# Patient Record
Sex: Female | Born: 1975 | Race: White | Hispanic: No | Marital: Married | Smoking: Never smoker
Health system: Southern US, Community
[De-identification: ages and names within clinical notes are randomized; demographics above are authoritative.]

## PROBLEM LIST (undated history)

## (undated) DIAGNOSIS — K219 Gastro-esophageal reflux disease without esophagitis: Secondary | ICD-10-CM

## (undated) DIAGNOSIS — F419 Anxiety disorder, unspecified: Secondary | ICD-10-CM

## (undated) DIAGNOSIS — M722 Plantar fascial fibromatosis: Secondary | ICD-10-CM

## (undated) HISTORY — DX: Plantar fascial fibromatosis: M72.2

## (undated) HISTORY — DX: Gastro-esophageal reflux disease without esophagitis: K21.9

## (undated) HISTORY — DX: Anxiety disorder, unspecified: F41.9

---

## 2002-12-30 ENCOUNTER — Encounter: Payer: Self-pay | Admitting: Obstetrics and Gynecology

## 2002-12-30 ENCOUNTER — Inpatient Hospital Stay (HOSPITAL_COMMUNITY): Admission: AD | Admit: 2002-12-30 | Discharge: 2002-12-30 | Payer: Self-pay | Admitting: Obstetrics and Gynecology

## 2003-01-18 ENCOUNTER — Other Ambulatory Visit: Admission: RE | Admit: 2003-01-18 | Discharge: 2003-01-18 | Payer: Self-pay | Admitting: *Deleted

## 2003-06-13 ENCOUNTER — Encounter: Admission: RE | Admit: 2003-06-13 | Discharge: 2003-06-13 | Payer: Self-pay | Admitting: Neurology

## 2003-12-28 ENCOUNTER — Other Ambulatory Visit: Admission: RE | Admit: 2003-12-28 | Discharge: 2003-12-28 | Payer: Self-pay | Admitting: Obstetrics and Gynecology

## 2004-04-07 HISTORY — PX: TUBAL LIGATION: SHX77

## 2004-06-14 ENCOUNTER — Other Ambulatory Visit: Admission: RE | Admit: 2004-06-14 | Discharge: 2004-06-14 | Payer: Self-pay | Admitting: Obstetrics and Gynecology

## 2004-12-24 ENCOUNTER — Inpatient Hospital Stay (HOSPITAL_COMMUNITY): Admission: RE | Admit: 2004-12-24 | Discharge: 2004-12-27 | Payer: Self-pay | Admitting: Obstetrics and Gynecology

## 2005-02-03 ENCOUNTER — Other Ambulatory Visit: Admission: RE | Admit: 2005-02-03 | Discharge: 2005-02-03 | Payer: Self-pay | Admitting: Obstetrics and Gynecology

## 2006-11-20 ENCOUNTER — Encounter (INDEPENDENT_AMBULATORY_CARE_PROVIDER_SITE_OTHER): Payer: Self-pay | Admitting: Obstetrics and Gynecology

## 2006-11-20 ENCOUNTER — Inpatient Hospital Stay (HOSPITAL_COMMUNITY): Admission: RE | Admit: 2006-11-20 | Discharge: 2006-11-22 | Payer: Self-pay | Admitting: Obstetrics and Gynecology

## 2006-11-23 ENCOUNTER — Encounter: Admission: RE | Admit: 2006-11-23 | Discharge: 2006-11-25 | Payer: Self-pay | Admitting: Obstetrics and Gynecology

## 2007-03-11 ENCOUNTER — Emergency Department (HOSPITAL_COMMUNITY): Admission: EM | Admit: 2007-03-11 | Discharge: 2007-03-11 | Payer: Self-pay | Admitting: Emergency Medicine

## 2010-02-06 ENCOUNTER — Emergency Department (HOSPITAL_BASED_OUTPATIENT_CLINIC_OR_DEPARTMENT_OTHER): Admission: EM | Admit: 2010-02-06 | Discharge: 2010-02-06 | Payer: Self-pay | Admitting: Emergency Medicine

## 2010-03-21 ENCOUNTER — Encounter
Admission: RE | Admit: 2010-03-21 | Discharge: 2010-03-21 | Payer: Self-pay | Source: Home / Self Care | Attending: Gastroenterology | Admitting: Gastroenterology

## 2010-03-28 ENCOUNTER — Ambulatory Visit (HOSPITAL_COMMUNITY): Admission: RE | Admit: 2010-03-28 | Payer: Self-pay | Source: Home / Self Care | Admitting: Gastroenterology

## 2010-04-28 ENCOUNTER — Encounter: Payer: Self-pay | Admitting: Gastroenterology

## 2010-06-18 LAB — COMPREHENSIVE METABOLIC PANEL
AST: 20 U/L (ref 0–37)
Albumin: 4.7 g/dL (ref 3.5–5.2)
BUN: 20 mg/dL (ref 6–23)
CO2: 24 mEq/L (ref 19–32)
Calcium: 9.5 mg/dL (ref 8.4–10.5)
Creatinine, Ser: 0.9 mg/dL (ref 0.4–1.2)
GFR calc Af Amer: >60 mL/min (ref >60)
GFR calc non Af Amer: >60 mL/min (ref >60)
Total Bilirubin: 0.6 mg/dL (ref 0.3–1.2)

## 2010-06-18 LAB — DIFFERENTIAL
Basophils Absolute: 0.1 10*3/uL (ref 0.0–0.1)
Eosinophils Relative: 2 % (ref 0–5)
Lymphocytes Relative: 28 % (ref 12–46)
Lymphs Abs: 2.1 10*3/uL (ref 0.7–4.0)
Neutro Abs: 4.8 10*3/uL (ref 1.7–7.7)

## 2010-06-18 LAB — CBC
MCH: 29 pg (ref 26.0–34.0)
MCHC: 33.8 g/dL (ref 30.0–36.0)
MCV: 85.7 fL (ref 78.0–100.0)
Platelets: 310 10*3/uL (ref 150–400)

## 2010-06-18 LAB — PREGNANCY, URINE: Preg Test, Ur: NEGATIVE

## 2010-06-18 LAB — URINALYSIS, ROUTINE W REFLEX MICROSCOPIC
Leukocytes, UA: NEGATIVE
Nitrite: NEGATIVE
Protein, ur: NEGATIVE mg/dL
Specific Gravity, Urine: 1.031 — ABNORMAL HIGH (ref 1.005–1.030)
Urobilinogen, UA: 0.2 mg/dL (ref 0.0–1.0)

## 2010-06-18 LAB — URINE MICROSCOPIC-ADD ON

## 2010-06-18 LAB — LIPASE, BLOOD: Lipase: 29 U/L (ref 23–300)

## 2010-08-20 NOTE — Op Note (Signed)
NAME:  Kelly Huffman, Kelly Huffman              ACCOUNT NO.:  [id number]   MEDICAL RECORD NO.:  [id number]          PATIENT TYPE:  AMB   LOCATION:  SDC                           FACILITY:  [facility]   PHYSICIAN:  Michelle L. Grewal, M.D.DATE OF BIRTH:  07-02-1975   DATE OF PROCEDURE:  11/20/2006  DATE OF DISCHARGE:                               OPERATIVE REPORT   PREOPERATIVE DIAGNOSES:  1. Intrauterine pregnancy at term.  2. Previous cesarean section.  3. Desires permanent sterilization.   POSTOPERATIVE DIAGNOSES:  1. Intrauterine pregnancy at term.  2. Previous cesarean section.  3. Desires permanent sterilization.  4. Uterine window.   PROCEDURES:  1. Repeat low transverse cesarean section.  2. Bilateral tubal ligation.   SURGEON:  Michelle L. Vincente Poli, MD   ASSISTANT:  Zelphia Cairo, MD   ANESTHESIA:  Spinal.   PATHOLOGY:  Fallopian tube segments.   PROCEDURE:  The patient was given informed consent.  After she was  consented, she was taken to the operating room.  A spinal was placed by  anesthesia.  She was then prepped and draped and Foley catheter was  inserted and draining clear urine.  A low transverse incision was made  in the area of previous C-section scar and carried down to the fascia.  The fascia was scored in the midline and extended laterally.  The rectus  muscles were noted to be in the midline.  When we attempted to separate  them in the midline with a hemostat, we noted suddenly a gush of  amniotic fluid emanating from the upper aspect of the rectus muscles.  We then separated the rectus muscles and noticed that just beneath the  incision of the rectus muscles there was a very large uterine window and  we could see the baby's ear.  There no need to be to use a scalpel for  incision on the uterus.  The baby was delivered in cephalic presentation  quite easily.  It was a female infant, Apgars 9 at one minute and 9 at  five minutes, and weighed 8 pounds 3 ounces.  The  cord was clamped and  cut and the baby was handed to the waiting neonatal team.  The baby was  very vigorous in the operating room.  The placenta was manually removed,  noted be normal, intact, with three-vessel cord.  Pitocin and  antibiotics were given.  The uterus was exteriorized and the cavity was  cleared of all clots and debris.  There was no extension noted.  The  area of the uterine window appeared to be coming from the region where  she had her previous C-section.  The incision was closed using 0 chromic  in continuous running locked stitch.  Hemostasis was excellent.  We then  performed a modified Pomeroy bilateral tubal ligation by grasping each  midportion of the fallopian tube with a Babcock clamp and tying it off  with using plain gut suture x2 and excising the tied-off knuckle of  fallopian tube with Metzenbaum scissors.  Hemostasis was excellent.  Each tube was sent separately to pathology.  The uterus was  returned to  the abdomen.  Irrigation was performed.  Hemostasis was noted at all  three surgical sites.  The rectus muscles and peritoneum were closed  using 0 Vicryl.  The fascia was closed using 0 Vicryl in  running stitch  x2 starting at each corner and meeting in the midline.  After irrigation  of the subcutaneous layer, the skin was closed with staples.  All  sponge, lap and instrument counts were correct x2.  The patient went to  recovery room in stable condition.      Michelle L. Vincente Poli, M.D.  Electronically Signed     MLG/MEDQ  D:  11/20/2006  T:  11/20/2006  Job:  [id number]

## 2010-08-23 NOTE — Discharge Summary (Signed)
NAMEJUANETTA, NEGASH              ACCOUNT NO.:  [id number]   MEDICAL RECORD NO.:  [id number]          PATIENT TYPE:  INP   LOCATION:  9148                          FACILITY:  [facility]   PHYSICIAN:  Juluis Mire, M.D.   DATE OF BIRTH:  04-26-1975   DATE OF ADMISSION:  11/20/2006  DATE OF DISCHARGE:  11/22/2006                               DISCHARGE SUMMARY   ADMITTING DIAGNOSIS:  1. Intrauterine pregnancy at term  2. Previous cesarean delivery, desires repeat  3. Multiparity, desires permanent sterilization.   DISCHARGE DIAGNOSIS:  1. Status post low transverse cesarean section  2. A viable female infant  3. Permanent sterilization.   PROCEDURE:  1. Repeat low transverse cesarean section  2. Bilateral tubal ligation.   REASON FOR ADMISSION:  Please see written H&P.   HOSPITAL COURSE:  The patient is 35 year old gravida 3, para 1 that  presented to [facility] for scheduled cesarean section.  Due to multiparity the patient has also requested bilateral tubal  ligation.  On the morning of admission the patient was taken to  operating room where spinal anesthesia was administered without  difficulty.  A low transverse incision was made with delivery of a  viable female infant weighing 8 pounds 3 ounces, Apgars 9 at 1 minute  and 9 at 5 minutes.  The patient tolerated procedure well and taken to  the recovery room in stable condition.  On postoperative day #1 the  patient without complaint.  Vital signs were stable.  She is afebrile.  Fundus was firm and nontender.  Abdominal dressing noted to be clean,  dry and intact.  Laboratory findings revealed hemoglobin of 11.0.  On  postoperative day #2 the patient was without complaint.  She did desire  early discharge.  Vital signs were stable.  She was afebrile, fundus was  firm and nontender.  Abdominal dressing had been removed revealing an  incision that is clean, dry and intact.  Staples were intact.  Discharge  instructions were reviewed and the patient was later discharged home.   CONDITION ON DISCHARGE:  Good, diet regular as tolerated.   ACTIVITY:  No heavy lifting, no driving x2 weeks, no vaginal entry.   FOLLOW UP:  Patient follow up in the office in 2-3 days for staple  removal.  She is to call for temperature greater than 100 degrees,  persistent nausea, vomiting, heavy vaginal bleeding and/or redness or  drainage from incisional site.   DISCHARGE MEDICATIONS:  Tylox #30 1 p.o. q.4-6h.  Motrin 600 mg every 6  hours.  Prenatal vitamins one p.o. daily, Colace 1 p.o. daily.      Julio Sicks, N.P.      Juluis Mire, M.D.  Electronically Signed    CC/MEDQ  D:  12/29/2006  T:  12/29/2006  Job:  [id number]

## 2010-08-23 NOTE — Discharge Summary (Signed)
Kelly Huffman, Kelly Huffman              ACCOUNT NO.:  [id number]   MEDICAL RECORD NO.:  [id number]          PATIENT TYPE:  INP   LOCATION:  9147                          FACILITY:  [facility]   PHYSICIAN:  Juluis Mire, M.D.   DATE OF BIRTH:  1975-11-17   DATE OF ADMISSION:  12/24/2004  DATE OF DISCHARGE:  12/27/2004                                 DISCHARGE SUMMARY   ADMISSION DIAGNOSES:  1.  Intrauterine pregnancy at term.  2.  Breech presentation.   DISCHARGE DIAGNOSES:  1.  Status post low transverse cesarean section.  2.  Viable female infant.   PROCEDURE:  Primary low transverse cesarean section.   REASON FOR ADMISSION:  Please see written H&P.   HOSPITAL COURSE:  The patient is a 35 year old primigravida that was  admitted to [facility] at term with a known breech  presentation.  The patient had been scheduled for a cesarean delivery.  On  the morning of admission, the patient was taken to the operating room where  spinal anesthesia was administered without difficulty.  A low transverse  incision was made with the delivery of a viable female infant weighing 8  pounds 6 ounces with Apgars of 9 at one minute and 9 at five minutes.  The  patient tolerated the procedure well and was taken to the recovery room in  stable condition.  On postoperative day #1, the patient was without  complaint, vital signs were stable.  Abdomen was soft, fundus was firm and  nontender.  Abdominal dressing was noted to be clean, dry, and intact.  Laboratory findings revealed hemoglobin of 10.0, platelet count 198,000, WBC  count of 10.2.  On postoperative day #2, the patient had experienced some  nausea, vital signs were stable.  Abdomen was flat and soft with positive  bowel sounds, however, somewhat sluggish.  The patient was given antiemetics  with resolution of nausea and vomiting.  On postoperative day #3, the  patient now is without complaint.  No further nausea and vomiting.  Vital  signs were stable.  She was tolerating a regular diet without complaints.  Fundus was firm and nontender.  Incision was clean, dry, and intact.  Staples were removed and the patient was discharged home.   CONDITION ON DISCHARGE:  Good.   DIET:  Regular as tolerated.   ACTIVITY:  No heavy lifting, no driving x2 weeks, and no vaginal entry.   FOLLOW UP:  The patient is to follow up in the office in 1 week for an  incision check.  She is to call for a temperature greater than 100 degrees,  persistent nausea and vomiting, heavy vaginal bleeding, and/or redness or  drainage from the incisional site.   DISCHARGE MEDICATIONS:  1.  Motrin 600 mg every 6 hours p.r.n.  2.  Prenatal vitamins one p.o. daily.      Julio Sicks, N.P.      Juluis Mire, M.D.  Electronically Signed    CC/MEDQ  D:  02/16/2005  T:  02/16/2005  Job:  [id number]

## 2010-08-23 NOTE — Op Note (Signed)
NAME:  Kelly Huffman, Kelly Huffman              ACCOUNT NO.:  [id number]   MEDICAL RECORD NO.:  [id number]          PATIENT TYPE:  INP   LOCATION:  9147                          [facility]ACILITY:  [facility]   PHYSICIAN:  Michelle L. Grewal, M.D.DATE OF BIRTH:  1975-11-30   DATE OF PROCEDURE:  12/24/2004  DATE OF DISCHARGE:                                 OPERATIVE REPORT   PREOPERATIVE DIAGNOSES:  1.  Intrauterine pregnancy at term.  2.  Breech presentation.   POSTOPERATIVE DIAGNOSES:  1.  Intrauterine pregnancy at term.  2.  Breech presentation.   PROCEDURE:  Primary low transverse cesarean section.   SURGEON:  Michelle L. Vincente Poli, M.D.   ANESTHESIA:  Spinal.   SPECIMENS:  Female infant, frank breech presentation, Apgars 9 at one minute  and 9 at five minutes, weighing 8 pounds 6 ounces.   ESTIMATED BLOOD LOSS:  500 mL.   COMPLICATIONS:  None.   DRAINS:  Foley.   PATHOLOGY:  None.   PROCEDURE:  The patient was taken to the operating room, where spinal was  placed without incident and she was prepped and draped in the usual sterile  fashion.  A Foley catheter was inserted into the bladder and draining clear  urine.  A sterile drape applied.  A low transverse incision was made,  carried down to the fascia, and fascia was scored in the midline.  The  rectus muscles were separated in the midline and the peritoneum was entered  bluntly.  The peritoneal incision was then stretched.  The bladder blade was  inserted and the lower uterine segment was identified and the bladder flap  was readjusted.  A low transverse incision was made after the bladder flap  was developed and the low transverse incision was made in the uterus.  The  uterus was entered using a hemostat.  The baby was in frank breech  presentation and was delivered quite easily.  There was a loose nuchal cord  x1.  Apgars were 9 at one minute and 9 at five minutes with a weight of 8  pounds 6 ounces.  After the cord was clamped and cut, the  cord blood was  obtained.  Placenta was manually removed and noted to be normal and intact  with a  three-vessel cord.  The uterus was then cleared of all clots and  debris.  The uterine incision was closed using 0 chromic in a continuous  running locked stitch.  It was hemostatic.  Irrigation was performed.  Hemostasis was again noted.  The peritoneum was closed using 0 Vicryl in  continuous running stitch and the rectus  muscles were reapproximated using the same 0 Vicryl.  The fascia was closed  using 0 Vicryl in continuous running stitch.  After irrigation of the  subcutaneous layer, the skin was closed with staples.  All sponge, lap and  instrument counts were correct x2.  The patient went to recovery room in  stable condition.      Michelle L. Vincente Poli, M.D.  Electronically Signed     MLG/MEDQ  D:  12/24/2004  T:  12/24/2004  Job:  [id number] 

## 2011-01-13 LAB — POCT PREGNANCY, URINE
Operator id: 288831
Preg Test, Ur: NEGATIVE

## 2011-01-13 LAB — DIFFERENTIAL
Basophils Absolute: 0
Basophils Relative: 0
Eosinophils Absolute: 0.1 — ABNORMAL LOW
Monocytes Relative: 4
Neutro Abs: 9 — ABNORMAL HIGH
Neutrophils Relative %: 80 — ABNORMAL HIGH

## 2011-01-13 LAB — COMPREHENSIVE METABOLIC PANEL
ALT: 15
Alkaline Phosphatase: 64
BUN: 23
CO2: 26
Chloride: 105
GFR calc non Af Amer: >60
Glucose, Bld: 108 — ABNORMAL HIGH
Potassium: 4.8
Sodium: 138
Total Bilirubin: 0.9

## 2011-01-13 LAB — CBC
HCT: 43.2
Hemoglobin: 14.8
RBC: 4.91
RDW: 12.7

## 2011-01-13 LAB — URINALYSIS, ROUTINE W REFLEX MICROSCOPIC
Bilirubin Urine: NEGATIVE
Hgb urine dipstick: NEGATIVE
Ketones, ur: NEGATIVE
Specific Gravity, Urine: 1.026
Urobilinogen, UA: 0.2

## 2011-01-13 LAB — LIPASE, BLOOD: Lipase: 16

## 2011-01-17 LAB — CBC
MCHC: 34.5
MCV: 92.5
RBC: 3.43 — ABNORMAL LOW
RDW: 13.5

## 2011-01-20 LAB — RAPID HIV SCREEN (WH-MAU): Rapid HIV Screen: NONREACTIVE

## 2011-01-20 LAB — CBC
MCHC: 34.2
MCV: 91.6
Platelets: 228
WBC: 7.3

## 2011-01-20 LAB — RPR: RPR Ser Ql: NONREACTIVE

## 2012-11-30 ENCOUNTER — Emergency Department (HOSPITAL_BASED_OUTPATIENT_CLINIC_OR_DEPARTMENT_OTHER)
Admission: EM | Admit: 2012-11-30 | Discharge: 2012-11-30 | Disposition: A | Discharge status: Home / Self Care | Payer: BC Managed Care – PPO | Attending: Emergency Medicine | Admitting: Emergency Medicine

## 2012-11-30 ENCOUNTER — Emergency Department (HOSPITAL_BASED_OUTPATIENT_CLINIC_OR_DEPARTMENT_OTHER): Payer: BC Managed Care – PPO

## 2012-11-30 ENCOUNTER — Encounter (HOSPITAL_BASED_OUTPATIENT_CLINIC_OR_DEPARTMENT_OTHER): Payer: Self-pay | Admitting: *Deleted

## 2012-11-30 DIAGNOSIS — Y939 Activity, unspecified: Secondary | ICD-10-CM | POA: Insufficient documentation

## 2012-11-30 DIAGNOSIS — Z23 Encounter for immunization: Secondary | ICD-10-CM | POA: Insufficient documentation

## 2012-11-30 DIAGNOSIS — Y929 Unspecified place or not applicable: Secondary | ICD-10-CM | POA: Insufficient documentation

## 2012-11-30 DIAGNOSIS — S81009A Unspecified open wound, unspecified knee, initial encounter: Secondary | ICD-10-CM | POA: Insufficient documentation

## 2012-11-30 DIAGNOSIS — S91011A Laceration without foreign body, right ankle, initial encounter: Secondary | ICD-10-CM

## 2012-11-30 DIAGNOSIS — W268XXA Contact with other sharp object(s), not elsewhere classified, initial encounter: Secondary | ICD-10-CM | POA: Insufficient documentation

## 2012-11-30 MED ORDER — HYDROCODONE-ACETAMINOPHEN 5-325 MG PO TABS: 2.0000 | ORAL_TABLET | ORAL | Status: DC | PRN

## 2012-11-30 MED ORDER — TETANUS-DIPHTH-ACELL PERTUSSIS 5-2.5-18.5 LF-MCG/0.5 IM SUSP
0.5000 mL | Freq: Once | INTRAMUSCULAR | Status: AC
  Administered 2012-11-30: 0.5 mL via INTRAMUSCULAR
  Filled 2012-11-30: qty 0.5

## 2012-11-30 MED ORDER — CEPHALEXIN 500 MG PO CAPS: 500.0000 mg | ORAL_CAPSULE | Freq: Four times a day (QID) | ORAL | Status: DC

## 2012-11-30 NOTE — Discharge Instructions (Signed)

## 2012-11-30 NOTE — ED Provider Notes (Signed)
CSN: [id number]     Arrival date & time 11/30/12  1620 History   None    Chief Complaint  Patient presents with  . Extremity Laceration   (Consider location/radiation/quality/duration/timing/severity/associated sxs/prior Treatment) Patient is a 37 y.o. female presenting with leg pain. The history is provided by the patient. No language interpreter was used.  Leg Pain Location:  Leg Injury: yes   Leg location:  R leg Pain details:    Quality: laceration.   Severity:  No pain   Timing:  Constant   Progression:  Worsening Chronicity:  New Pt dropped a glass that broke on her foot  History reviewed. No pertinent past medical history. Past Surgical History  Procedure Laterality Date  . Cesarean section     No family history on file. History  Substance Use Topics  . Smoking status: Never Smoker   . Smokeless tobacco: Not on file  . Alcohol Use: No   OB History   Grav Para Term Preterm Abortions TAB SAB Ect Mult Living                 Review of Systems  Skin: Positive for wound.  All other systems reviewed and are negative.    Allergies  Review of patient's allergies indicates no known allergies.  Home Medications  No current outpatient prescriptions on file. BP 146/76  Pulse 76  Temp(Src) 97.7 F (36.5 C) (Oral)  Resp 16  Ht 5\' 2"  (1.575 m)  Wt 125 lb (56.7 kg)  BMI 22.86 kg/m2  SpO2 100%  LMP 11/16/2012 Physical Exam  Nursing note and vitals reviewed. Constitutional: She appears well-developed and well-nourished.  HENT:  Head: Normocephalic.  Cardiovascular: Normal rate.   Pulmonary/Chest: Effort normal.  Musculoskeletal: She exhibits tenderness.  1cm laceration right foot dorsal aspect at ankle  Neurological: She is alert.  Skin: Skin is warm.    ED Course  LACERATION REPAIR Date/Time: 11/30/2012 7:03 PM Performed by: Elson Areas Authorized by: Elson Areas Consent: Verbal consent obtained. Consent given by: patient Patient identity  confirmed: verbally with patient Body area: lower extremity Location details: right foot Laceration length: 1 cm Foreign bodies: no foreign bodies Tendon involvement: suspect tendon laceration. Nerve involvement: none Vascular damage: no Local anesthetic: lidocaine 1% without epinephrine Preparation: Patient was prepped and draped in the usual sterile fashion. Irrigation solution: saline Amount of cleaning: standard Debridement: none Degree of undermining: none Skin closure: 5-0 Prolene Number of sutures: 2 Technique: simple Approximation difficulty: simple Patient tolerance: Patient tolerated the procedure well with no immediate complications.   (including critical care time) Labs Review Labs Reviewed - No data to display Imaging Review Dg Ankle Complete Right  11/30/2012   *RADIOLOGY REPORT*  Clinical Data: Laceration of the right anterior ankle with glass.  RIGHT ANKLE - COMPLETE 3+ VIEW  Comparison: Foot films same date  Findings: No acute fracture or dislocation.  Soft tissue injury anterior to the ankle joint. No radio-opaque foreign body.  IMPRESSION: Anterior soft tissue injury, without acute osseous finding.   Original Report Authenticated By: Jeronimo Greaves, M.D.   Dg Foot Complete Right  11/30/2012   *RADIOLOGY REPORT*  Clinical Data: Laceration to anterior ankle with glass.  RIGHT FOOT COMPLETE - 3+ VIEW  Comparison: Ankle films same date  Findings: Lateral view degraded by patient arm position.  A soft tissue injury identified anterior to the ankle joint on the lateral view. No radio-opaque foreign body.  IMPRESSION: Anterior soft tissue injury, without acute osseous  finding.   Original Report Authenticated By: Jeronimo Greaves, M.D.   I spoke to Dr. Roda Shutters orthopaedist.   I suspect pt has a tendon laceration.  He advised close skin antibiotics.   Pt to call his office for appointment time MDM   1. Laceration of right ankle, initial encounter        Elson Areas,  PA-C 11/30/12 1909  Lonia Skinner [place], [place] 11/30/12 1909

## 2012-11-30 NOTE — ED Notes (Signed)
Patient fitted appropriately for crutches. Patient demonstrates appropriate use of crutches. Verbalizes that she works for [employer] and plans to get a knee walker. Patient encouraged to use crutches and call department if they need to be adjusted or if she has further questions

## 2012-11-30 NOTE — ED Notes (Signed)
tdap espires [id number]

## 2012-11-30 NOTE — ED Notes (Signed)
Glass broke on her right foot. Small lac to front of ankle.

## 2012-12-01 NOTE — ED Provider Notes (Signed)
Medical screening examination/treatment/procedure(s) were performed by non-physician practitioner and as supervising physician I was immediately available for consultation/collaboration.   Silvester Reierson T Antoneo Ghrist, MD 12/01/12 1137 

## 2013-05-27 ENCOUNTER — Other Ambulatory Visit: Payer: Self-pay | Admitting: Obstetrics and Gynecology

## 2014-05-30 ENCOUNTER — Other Ambulatory Visit: Payer: Self-pay | Admitting: Obstetrics and Gynecology

## 2014-05-31 LAB — CYTOLOGY - PAP

## 2016-09-21 DIAGNOSIS — M722 Plantar fascial fibromatosis: Secondary | ICD-10-CM

## 2016-09-21 HISTORY — DX: Plantar fascial fibromatosis: M72.2

## 2016-10-14 ENCOUNTER — Emergency Department (HOSPITAL_BASED_OUTPATIENT_CLINIC_OR_DEPARTMENT_OTHER)
Admission: EM | Admit: 2016-10-14 | Discharge: 2016-10-14 | Disposition: A | Discharge status: Home / Self Care | Payer: BLUE CROSS/BLUE SHIELD | Attending: Emergency Medicine | Admitting: Emergency Medicine

## 2016-10-14 ENCOUNTER — Encounter (HOSPITAL_BASED_OUTPATIENT_CLINIC_OR_DEPARTMENT_OTHER): Payer: Self-pay | Admitting: *Deleted

## 2016-10-14 DIAGNOSIS — G4700 Insomnia, unspecified: Secondary | ICD-10-CM | POA: Diagnosis not present

## 2016-10-14 LAB — COMPREHENSIVE METABOLIC PANEL
ALT: 16 U/L (ref 14–54)
ANION GAP: 8 (ref 5–15)
AST: 21 U/L (ref 15–41)
Albumin: 4 g/dL (ref 3.5–5.0)
Alkaline Phosphatase: 45 U/L (ref 38–126)
BILIRUBIN TOTAL: 0.6 mg/dL (ref 0.3–1.2)
BUN: 13 mg/dL (ref 6–20)
CO2: 28 mmol/L (ref 22–32)
Calcium: 9.2 mg/dL (ref 8.9–10.3)
Chloride: 102 mmol/L (ref 101–111)
Creatinine, Ser: 0.83 mg/dL (ref 0.44–1.00)
GFR calc Af Amer: >60 mL/min (ref >60)
Glucose, Bld: 92 mg/dL (ref 65–99)
POTASSIUM: 3.9 mmol/L (ref 3.5–5.1)
Sodium: 138 mmol/L (ref 135–145)
TOTAL PROTEIN: 7.1 g/dL (ref 6.5–8.1)

## 2016-10-14 LAB — CBC WITH DIFFERENTIAL/PLATELET
BASOS ABS: 0 10*3/uL (ref 0.0–0.1)
BASOS PCT: 0 %
Eosinophils Absolute: 0 10*3/uL (ref 0.0–0.7)
Eosinophils Relative: 1 %
HCT: 38.3 % (ref 36.0–46.0)
Hemoglobin: 12.7 g/dL (ref 12.0–15.0)
Lymphocytes Relative: 13 %
Lymphs Abs: 0.9 10*3/uL (ref 0.7–4.0)
MCH: 28.5 pg (ref 26.0–34.0)
MCHC: 33.2 g/dL (ref 30.0–36.0)
MCV: 85.9 fL (ref 78.0–100.0)
Monocytes Absolute: 0.5 10*3/uL (ref 0.1–1.0)
Monocytes Relative: 7 %
NEUTROS ABS: 5.8 10*3/uL (ref 1.7–7.7)
Neutrophils Relative %: 79 %
Platelets: 310 10*3/uL (ref 150–400)
RBC: 4.46 MIL/uL (ref 3.87–5.11)
RDW: 15 % (ref 11.5–15.5)
WBC: 7.2 10*3/uL (ref 4.0–10.5)

## 2016-10-14 LAB — URINALYSIS, ROUTINE W REFLEX MICROSCOPIC
Bilirubin Urine: NEGATIVE
Glucose, UA: NEGATIVE mg/dL
HGB URINE DIPSTICK: NEGATIVE
Ketones, ur: NEGATIVE mg/dL
LEUKOCYTES UA: NEGATIVE
Nitrite: NEGATIVE
PROTEIN: NEGATIVE mg/dL
Specific Gravity, Urine: 1.009 (ref 1.005–1.030)
pH: 8 (ref 5.0–8.0)

## 2016-10-14 LAB — TSH: TSH: 1.38 u[IU]/mL (ref 0.350–4.500)

## 2016-10-14 LAB — PREGNANCY, URINE: PREG TEST UR: NEGATIVE

## 2016-10-14 MED ORDER — ZOLPIDEM TARTRATE 5 MG PO TABS: 5.0000 mg | ORAL_TABLET | Freq: Every evening | ORAL | 0 refills | Status: DC | PRN

## 2016-10-14 MED ORDER — DIPHENHYDRAMINE HCL 50 MG/ML IJ SOLN
50.0000 mg | Freq: Once | INTRAMUSCULAR | Status: AC
Start: 2016-10-14 — End: 2016-10-14
  Administered 2016-10-14: 50 mg via INTRAVENOUS
  Filled 2016-10-14: qty 1

## 2016-10-14 MED ORDER — PROCHLORPERAZINE EDISYLATE 5 MG/ML IJ SOLN
10.0000 mg | Freq: Once | INTRAMUSCULAR | Status: AC
  Administered 2016-10-14: 10 mg via INTRAVENOUS
  Filled 2016-10-14: qty 2

## 2016-10-14 MED ORDER — SODIUM CHLORIDE 0.9 % IV BOLUS (SEPSIS)
1000.0000 mL | Freq: Once | INTRAVENOUS | Status: AC
  Administered 2016-10-14: 1000 mL via INTRAVENOUS

## 2016-10-14 MED FILL — ZOLPIDEM TARTRATE 5 MG TAB: 5 | 7 days supply | Qty: 7 | Fill #0

## 2016-10-14 NOTE — ED Provider Notes (Signed)
[facility] Provider Note   CSN: [id number] Arrival date & time: 10/14/16  0820     History   Chief Complaint Chief Complaint  Patient presents with  . Insomnia    HPI Kelly Huffman is a 41 y.o. female.  The history is provided by the patient, the spouse and medical records. No language interpreter was used.  Insomnia  This is a new problem. The current episode started more than 1 week ago. The problem occurs constantly. The problem has been gradually worsening. Pertinent negatives include no chest pain, no abdominal pain, no headaches and no shortness of breath. Nothing aggravates the symptoms. Nothing relieves the symptoms. The treatment provided no relief.    History reviewed. No pertinent past medical history.  There are no active problems to display for this patient.   Past Surgical History:  Procedure Laterality Date  . CESAREAN SECTION      OB History    No data available       Home Medications    Prior to Admission medications   Not on File    Family History History reviewed. No pertinent family history.  Social History Social History  Substance Use Topics  . Smoking status: Never Smoker  . Smokeless tobacco: Not on file  . Alcohol use No     Allergies   Patient has no known allergies.   Review of Systems Review of Systems  Constitutional: Positive for appetite change, fatigue and unexpected weight change (wt oss). Negative for chills and fever.  HENT: Negative for congestion and rhinorrhea.   Eyes: Negative for visual disturbance.  Respiratory: Negative for cough, chest tightness, shortness of breath, wheezing and stridor.   Cardiovascular: Positive for palpitations. Negative for chest pain and leg swelling.  Gastrointestinal: Positive for diarrhea. Negative for abdominal pain, constipation, nausea and vomiting.  Genitourinary: Negative for dysuria.  Musculoskeletal: Negative for back pain, myalgias, neck pain and neck  stiffness.  Skin: Negative for wound.  Neurological: Negative for light-headedness and headaches.  Psychiatric/Behavioral: Positive for sleep disturbance. Negative for agitation and confusion. The patient has insomnia. The patient is not hyperactive.   All other systems reviewed and are negative.    Physical Exam Updated Vital Signs BP 117/73 (BP Location: Right Arm)   Pulse 89   Temp 98.1 F (36.7 C) (Oral)   Resp 18   Ht 5\' 2"  (1.575 m)   Wt 49.9 kg (110 lb)   LMP 10/05/2016   SpO2 100%   BMI 20.12 kg/m   Physical Exam  Constitutional: She is oriented to person, place, and time. She appears well-developed and well-nourished. No distress.  HENT:  Head: Normocephalic and atraumatic.  Nose: Nose normal.  Mouth/Throat: Oropharynx is clear and moist. No oropharyngeal exudate.  Eyes: Conjunctivae and EOM are normal. Pupils are equal, round, and reactive to light.  Neck: Normal range of motion. Neck supple.  Cardiovascular: Normal rate, normal heart sounds and intact distal pulses.   No murmur heard. Pulmonary/Chest: Effort normal and breath sounds normal. No stridor. No respiratory distress. She has no wheezes. She exhibits no tenderness.  Abdominal: Soft. There is no tenderness.  Musculoskeletal: She exhibits no edema or tenderness.  Neurological: She is alert and oriented to person, place, and time. No cranial nerve deficit or sensory deficit. She exhibits normal muscle tone. Coordination normal.  Skin: Skin is warm and dry. Capillary refill takes less than 2 seconds. No rash noted. She is not diaphoretic. No erythema.  Nursing note  and vitals reviewed.    ED Treatments / Results  Labs (all labs ordered are listed, but only abnormal results are displayed) Labs Reviewed  URINALYSIS, ROUTINE W REFLEX MICROSCOPIC - Abnormal; Notable for the following:       Result Value   APPearance CLOUDY (*)    All other components within normal limits  CBC WITH DIFFERENTIAL/PLATELET    COMPREHENSIVE METABOLIC PANEL  PREGNANCY, URINE  TSH    EKG  EKG Interpretation  Date/Time:  Tuesday October 14 2016 09:54:34 EDT Ventricular Rate:  129 PR Interval:    QRS Duration: 76 QT Interval:  319 QTC Calculation: 468 R Axis:   76 Text Interpretation:  Sinus tachycardia RSR' in V1 or V2, probably normal variant Borderline repolarization abnormality When compared to prior, faster rate.  No STEMI Confirmed by Theda Belfast ([id number]) on 10/14/2016 12:37:33 PM       Radiology No results found.  Procedures Procedures (including critical care time)       Medications Ordered in ED Medications  sodium chloride 0.9 % bolus 1,000 mL (0 mLs Intravenous Stopped 10/14/16 1300)  diphenhydrAMINE (BENADRYL) injection 50 mg (50 mg Intravenous Given 10/14/16 0949)  prochlorperazine (COMPAZINE) injection 10 mg (10 mg Intravenous Given 10/14/16 0949)     Initial Impression / Assessment and Plan / ED Course  I have reviewed the triage vital signs and the nursing notes.  Pertinent labs & imaging results that were available during my care of the patient were reviewed by me and considered in my medical decision making (see chart for details).     Kelly Huffman is a 41 y.o. female with a past medical history significant for plantar fasciitis status post recent cortisone injections who presents with insomnia, palpitations, fatigue, weight loss, and decreased oral intake. Patient says that one month ago, she was having worsened left foot after fasciitis troubles. She reports that she was given 2 cortisone injections for now between 2 weeks. She says that since the injections, she has been having severe insomnia. She has not been able to sleep. She reports that she has seen her PCPs office 3 times. She reports that the first time, she was given a prescription for hydroxyzine. She says that this relaxed her but did not allow her to sleep. The second time she was given prescription for trazodone  which she also tried for several days. She says relaxed her with no sleep. Third time she was given Xanax but says this made her feel bad and did not help with sleep. She is also tried acupuncture, over-the-counter sleep aids including p.m. ibuprofen, melatonin, and others. She reports that over the last several days she has had 0 sleep. She says both she and her husband have missed 1 week of work.  Patient is also concerned because she reports she is not eating and drinking, she has had 10 pound weight loss in the last 2 weeks, and she is having some palpitations. She denies chest pain or shortness of breath. She denies any other preceding symptoms. She denies any constipation or dysuria but reports some diarrhea.  History and exam are seen above. On exam, patient has no focal neurologic deficits. Normal sensation, strength in all extremity. Normal finger-nose-finger. No facial droop. Normal coordination. Lungs are clear and chest is nontender. No CVA tenderness. Abdomen nontender. No extremity edema.  Based on patient's symptoms, suspect dehydration. With the poor intake and diarrhea, patient will have laboratory testing to look for electrolyte abnormalities. Patient will be  given given the patient's severe insomnia and fatigue, shared decision-making conversation was held to discuss options. Patient will be given a headache cocktail to try and allow her to rest in the emergency department. Neurology will also be called for management recommendations and discussion of possible sleep referral.  Anticipate reassessment following workup.  10:06 AM Nursing reports that briefly after receiving the injections, she had increase in her heart rate to the 130s. EKG was obtained showing sinus tachycardia. No evidence of acute ischemia.  Patient was observed for a period time with improvement in heart rate back to a normal rate.   12:32 PM Patient reassessed and she is still unable to sleep. She reports  feeling more drowsy and sleepy and has been unable to actually fall asleep.  After failure of cocktail, neurology will be called for recommendations.   Neurology called and felt the patient needs further outpatient workup of her insomnia. Dr. Wilford CornerArora felt patient should follow-up with [facility] for general neurology evaluation as well as a sleep study with the sleep study team. He felt that it was reasonable to start the patient on Ambien to try for the next week to see if that helps. He did not recommend inpatient management or other changes at this time.  Patient was given prescription for Ambien as well as the recommendation to call but the sleep team and neurology. Patient understood return precautions for any new or worsened symptoms. Patient had no other changes or complaints in ED. Patient discharged in good condition.     Final Clinical Impressions(s) / ED Diagnoses   Final diagnoses:  Insomnia, unspecified type    New Prescriptions Discharge Medication List as of 10/14/2016  2:27 PM    START taking these medications   Details  zolpidem (AMBIEN) 5 MG tablet Take 1 tablet (5 mg total) by mouth at bedtime as needed for sleep., Starting Tue 10/14/2016, Print        Clinical Impression: 1. Insomnia, unspecified type     Disposition: Discharge  Condition: Good  I have discussed the results, Dx and Tx plan with the pt(& family if present). He/she/they expressed understanding and agree(s) with the plan. Discharge instructions discussed at great length. Strict return precautions discussed and pt &/or family have verbalized understanding of the instructions. No further questions at time of discharge.    Discharge Medication List as of 10/14/2016  2:27 PM    START taking these medications   Details  zolpidem (AMBIEN) 5 MG tablet Take 1 tablet (5 mg total) by mouth at bedtime as needed for sleep., Starting Tue 10/14/2016, Print        Follow Up: [facility] [address]     [address] [place] [phone] Schedule an appointment as soon as possible for a visit    [facility] [address] [id number] mc [address] [id number] [phone]  If symptoms worsen  Johny BlamerHarris, William, MD [address] [place] [id number] [phone]  Schedule an appointment as soon as possible for a visit    [facility] [address] [place] [id number] [phone] Schedule an appointment as soon as possible for a visit       Tegeler, Canary Brimhristopher J, MD 10/14/16 1600

## 2016-10-14 NOTE — Discharge Instructions (Signed)
Please call and schedule appointment with [facility] for a general neurology evaluation for your insomnia. Please call the sleep center to schedule a sleep study for your neurology team. Please try taking the Ambien before bed to help with her sleep. If any symptoms change or worsen, please return to the nearest emergency department.

## 2016-10-14 NOTE — ED Triage Notes (Signed)
Pt reports insomnia x 2 weeks. States that she has been to her PCP and has been given medication without relief.

## 2016-10-21 ENCOUNTER — Encounter: Payer: Self-pay | Admitting: Neurology

## 2016-10-21 ENCOUNTER — Ambulatory Visit (INDEPENDENT_AMBULATORY_CARE_PROVIDER_SITE_OTHER): Payer: BLUE CROSS/BLUE SHIELD | Admitting: Neurology

## 2016-10-21 VITALS — BP 158/88 | HR 73 | Ht 62.0 in | Wt 112.0 lb

## 2016-10-21 DIAGNOSIS — G4700 Insomnia, unspecified: Secondary | ICD-10-CM | POA: Diagnosis not present

## 2016-10-21 NOTE — Patient Instructions (Addendum)
Please remember to try to maintain good sleep hygiene, which means: Keep a regular sleep and wake schedule, try not to exercise or have a meal within 2 hours of your bedtime, try to keep your bedroom conducive for sleep, that is, cool and dark, without light distractors such as an illuminated alarm clock, and refrain from watching TV right before sleep or in the middle of the night and do not keep the TV or radio on during the night. Also, try not to use or play on electronic devices at bedtime, such as your cell phone, tablet PC or laptop. If you like to read at bedtime on an electronic device, try to dim the background light as much as possible. Do not eat in the middle of the night.   Your acute insomnia may be due to your recent steroid injections; it may take time to get out of your system. You can still take hydroxyzine as needed; it works better with breaks in between, not nightly.   In the meantime, please keep following with your primary care provider.  She can try additional medication in low dose, such as Lunesta or Belsomra.    You can try Melatonin at night for sleep: take 1 mg to up to 3 mg, one to 2 hours before your bedtime. It is over the counter.  I am glad your anxiety is better, please consider referral to a psychologist if you have more chronic difficulty with sleep (such as over 3 months); your primary care can make a referral for cognitive behavioral therapy (CBT-I).

## 2016-10-21 NOTE — Progress Notes (Signed)
Subjective:    Patient ID: Kelly Huffman is a 41 y.o. female.  HPI     Kelly FoleySaima Minh Jasper, MD, PhD [facility] [address]  I saw patient, Kelly Huffman, as a referral from the emergency room for insomnia. The patient is unaccompanied today. She is a 41 year old right-handed woman with an underlying medical history of plantar fasciitis who reports difficulty with sleep onset and sleep maintenance for the past month or so, since her steroid injections into her foot for plantar fasciitis. Prior to that, she reports no difficulty with sleep, no episodic insomnia, no significant anxiety disorder, never been on medication for anxiety. She had her first steroid injection on 09/17/2016, another one on 10/01/2016. After the first injection, after a few days she started having anxiety and difficulty with her sleep. After the second injection she had severe anxiety and severe insomnia. She was tried on medication by her primary care provider including hydroxyzine, then trazodone which caused side effects, Xanax, which did not help. She presented to the emergency room on 10/14/2016 with a one-week history of inability to sleep. She had seen her primary care provider about 3 times by that time. She also reported in the emergency room that she had loss of appetite and that she had lost weight. She was treated symptomatically in the emergency room with Compazine, Benadryl, IV fluids. She did not fall asleep during the emergency room visit as I understand. She was given a prescription for Ambien 5 mg. Ambien caused amnesia and she did not feel good after it, she has lost about 10 pounds, appetite just recently came back and she started eating regularly. Anxiety is better. She has no family history of insomnia. She started acupuncture and herbal treatment last week, had 2 sessions of acupuncture and to planned for today, last session yesterday. She  has tried over-the-counter melatonin at 5 mg but had significant daytime grogginess. She is also trying Chinese tea and lemon balm extract in drop form by mouth. 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 She lives at home with her family which includes her husband and 765 year old son and 40-year-old daughter. She denies snoring, witnessed apneas, restless leg syndrome type symptoms, sleep disturbance or parasomnias before this. She currently does not utilize caffeine, she does not drink alcohol regularly.  Her Past Medical History Is Significant For: No past medical history on file.  Her Past Surgical History Is Significant For: Past Surgical History:  Procedure Laterality Date  . CESAREAN SECTION      Her Family History Is Significant For: No family history on file.  Her Social History Is Significant For: Social History   Social History  . Marital status: Married    Spouse name: N/A  . Number of children: N/A  . Years of education: N/A   Social History Main Topics  . Smoking status: Never Smoker  . Smokeless tobacco: Never Used  . Alcohol use No  . Drug use: Unknown  . Sexual activity: Not Asked   Other Topics Concern  . None   Social History Narrative  . None    Her Allergies Are:  Allergies  Allergen Reactions  . Cortisone Other (See Comments)    Severe anxiety and insomnia  :   Her Current Medications Are:  Outpatient Encounter Prescriptions as of 10/21/2016  Medication Sig  . [DISCONTINUED] hydrOXYzine (ATARAX/VISTARIL) 25 MG tablet Take 50 mg by mouth at bedtime.  . [  DISCONTINUED] zolpidem (AMBIEN) 5 MG tablet Take 1 tablet (5 mg total) by mouth at bedtime as needed for sleep.   No facility-administered encounter medications on file as of 10/21/2016.   :  Review of Systems:  Out of a complete 14 point review of systems, all are reviewed and negative with the exception of these symptoms as listed below: Review of Systems  Neurological:        Pt presents today to discuss her insomnia. Pt received two cortisone shots in June and since then, she has not been able to sleep, and only can sleep sporadically for 1-4 hours at a time. Pt has tried hydroxyzine, trazodone, xanax, and ambien, all of which did not help her sleep. Pt was offered effexor as well but pt never did start it. Pt does not endorse snoring and has never had a sleep study.  Epworth Sleepiness Scale 0= would never doze 1= slight chance of dozing 2= moderate chance of dozing 3= high chance of dozing  Sitting and reading: 0 Watching TV: 0 Sitting inactive in a public place (ex. Theater or meeting): 0 As a passenger in a car for an hour without a break: 0 Lying down to rest in the afternoon: 0 Sitting and talking to someone: 0 Sitting quietly after lunch (no alcohol): 0 In a car, while stopped in traffic: 0 Total: 0     Objective:  Neurological Exam  Physical Exam Physical Examination:   Vitals:   10/21/16 1610  BP: (!) 158/88  Pulse: 73   General Examination: The patient is a very pleasant 41 y.o. female in no acute distress. She appears well-developed and well-nourished and well groomed. Slender, mildly anxious appearing.   HEENT: Normocephalic, atraumatic, pupils are equal, round and reactive to light and accommodation. Extraocular tracking is good without limitation to gaze excursion or nystagmus noted. Normal smooth pursuit is noted. Hearing is grossly intact. Face is symmetric with normal facial animation and normal facial sensation. Speech is clear with no dysarthria noted. There is no hypophonia. There is no lip, neck/head, jaw or voice tremor. Neck with good ROM. There are no carotid bruits on auscultation. Oropharynx exam reveals: mild mouth dryness, good dental hygiene and no significant airway crowding, smaller airway anatomy. Tonsils small. Mallampati is class I. Tongue protrudes centrally and palate elevates symmetrically. Neck size is  slender.   Chest: Clear to auscultation without wheezing, rhonchi or crackles noted.  Heart: S1+S2+0, regular and normal without murmurs, rubs or gallops noted.   Abdomen: Soft, non-tender and non-distended with normal bowel sounds appreciated on auscultation.  Extremities: There is no pitting edema in the distal lower extremities bilaterally. Pedal pulses are intact.  Skin: Warm and dry without trophic changes noted.  Musculoskeletal: exam reveals no obvious joint deformities, tenderness or joint swelling or erythema.   Neurologically:  Mental status: The patient is awake, alert and oriented in all 4 spheres. Her immediate and remote memory, attention, language skills and fund of knowledge are appropriate. There is no evidence of aphasia, agnosia, apraxia or anomia. Speech is clear with normal prosody and enunciation. Thought process is linear. Mood is normal and affect is constricted.  Cranial nerves II - XII are as described above under HEENT exam. In addition: shoulder shrug is normal with equal shoulder height noted. Motor exam: Normal bulk, strength and tone is noted. There is no drift, tremor or rebound. Romberg is negative. Reflexes are 2+ throughout. Fine motor skills and coordination: grossly intact.  Cerebellar testing: No  dysmetria or intention tremor. There is no truncal or gait ataxia.  Sensory exam: intact to light touch in the upper and lower extremities.  Gait, station and balance: She stands easily. No veering to one side is noted. No leaning to one side is noted. Posture is age-appropriate and stance is narrow based. Gait shows normal stride length and normal pace. No problems turning are noted.   Assessment and Plan:   In summary, Kelly Huffman is a very pleasant 41 y.o.-year old female with an underlying medical history of plantar fasciitis who Presents as a referral from the emergency room for acute insomnia. Her symptoms started about a month ago when she first  received a local steroid injection for her plantar fasciitis, she had a second injection about 2 weeks later.  She has tried several medications including over-the-counter medications and prescription medication. She is encouraged to continue with her hydroxyzine but advised that it tends to work better if there are breaks in between, not on a nightly basis. She is advised to follow-up with her primary care provider, she may be able to try additional prescription sleep aids for a brief period of time such as [place] or Belsomra. She is encouraged to retry melatonin at a lower dose, 1 mg to up to 3 mg, 1-2 hours before projected bedtime. I reminded patient to keep a good schedule and sleep hygiene. She is currently not utilizing any caffeine and does not drink alcohol on a regular basis, she is reminded not to utilize alcohol as a sleep aid.  Physical exam and neurological exam are nonfocal. Her anxiety has improved, her appetite is returning and she is eating on a regular basis. I did advise patient that for chronic insomnia such as 3 months and longer she can consider referral to psychology for consideration of cognitive behavioral therapy, she is advised to talk to her primary care physician about this. I do not believe she needs a sleep study at this time. She has no concerning history for an underlying organic sleep disorder such as sleep apnea or restless leg syndrome. I answered all her questions today and she was in agreement with the plan.   Kelly Foley, MD, PhD

## 2016-11-13 ENCOUNTER — Institutional Professional Consult (permissible substitution): Payer: Self-pay | Admitting: Neurology

## 2017-03-23 ENCOUNTER — Encounter: Payer: Self-pay | Admitting: *Deleted

## 2017-03-25 DIAGNOSIS — R002 Palpitations: Secondary | ICD-10-CM | POA: Insufficient documentation

## 2017-03-25 NOTE — Progress Notes (Signed)
Cardiology Office Note:    Date:  03/26/2017   ID:  Kelly CoupBrandy L Vallier, DOB 1975-09-30, MRN [id number]  PCP:  Eather ColasHunter, Megan A, FNP  Cardiologist:  Norman HerrlichBrian Layton Tappan, MD   Referring MD: Eather ColasHunter, Megan A, FNP  ASSESSMENT:    1. Palpitation   2. Short PR-normal QRS complex syndrome    PLAN:    In order of problems listed above:  1. Her symptoms are quite suggestive of symptomatic PVCs.  She has been using over-the-counter decongestant and will stop.  As symptoms are happening frequently daily will utilize a 48-hour event monitor and if PVCs are found I will place her on a selective beta-blocker for relief of palpitation.  To screen for underlying cardiomyopathy echocardiogram is ordered and recent labs requested from her PCP to look at potassium.  I do not think she requires an ischemia evaluation 2. Stable EKG pattern no evidence of preexcitation  Next appointment   Medication Adjustments/Labs and Tests Ordered: Current medicines are reviewed at length with the patient today.  Concerns regarding medicines are outlined above.  Orders Placed This Encounter  Procedures  . Holter monitor - 48 hour  . EKG 12-Lead  . ECHOCARDIOGRAM COMPLETE   No orders of the defined types were placed in this encounter.    Chief Complaint  Patient presents with  . New Patient (Initial Visit)  . Palpitations    Feels like her heart stops for a second and then the next beat is harder    History of Present Illness:    Kelly Huffman is a 41 y.o. female who is being seen today for the evaluation of palpitation at the request of [facility]. She had the onset of palpitation in the summer after 2 steroid injections for plantar fasciitis.  Overall the time she felt very badly with changes in her mood strength and endurance and menstrual cycle which she attributes to the steroids.  Since that time she has frequent palpitation throughout the day but more at rest more in the evening and not with  physical activity.  She perceives a pause and forceful contraction with mild shortness of breath and at times lightheadedness.  She is not having rapid heart rhythm has not had syncope and has no family history of cardiomyopathy or sudden cardiac death.  She has no systemic disease putting her at risk for infiltrative cardiomyopathy  Past Medical History:  Diagnosis Date  . GERD (gastroesophageal reflux disease)   . Plantar fasciitis 09/21/2016    Past Surgical History:  Procedure Laterality Date  . CESAREAN SECTION    . TUBAL LIGATION  2006    Current Medications: Current Meds  Medication Sig  . Multiple Vitamin (MULTIVITAMIN) capsule Take 1 capsule by mouth daily.     Allergies:   Cortisone   Social History   Socioeconomic History  . Marital status: Married    Spouse name: None  . Number of children: None  . Years of education: None  . Highest education level: None  Social Needs  . Financial resource strain: None  . Food insecurity - worry: None  . Food insecurity - inability: None  . Transportation needs - medical: None  . Transportation needs - non-medical: None  Occupational History  . None  Tobacco Use  . Smoking status: Never Smoker  . Smokeless tobacco: Never Used  Substance and Sexual Activity  . Alcohol use: No  . Drug use: No  . Sexual activity: None  Other Topics Concern  .  None  Social History Narrative  . None     Family History: The patient's family history includes Heart attack in her father and paternal grandfather; Heart disease in her paternal uncle.  ROS:   Review of Systems  Constitution: Negative.  HENT: Negative.   Eyes: Negative.   Cardiovascular: Positive for palpitations. Negative for chest pain, claudication, cyanosis, dyspnea on exertion, irregular heartbeat, leg swelling, near-syncope, orthopnea and paroxysmal nocturnal dyspnea.  Respiratory: Negative.   Endocrine: Negative.   Hematologic/Lymphatic: Negative.   Skin: Negative.    Musculoskeletal: Negative.   Gastrointestinal: Negative.   Genitourinary: Positive for missed menses.  Neurological: Negative.   Psychiatric/Behavioral: Negative.   Allergic/Immunologic: Negative.    Please see the history of present illness.     All other systems reviewed and are negative.  EKGs/Labs/Other Studies Reviewed:    The following studies were reviewed today: Records from urgent care reviewed to the visit  [facility]  [facility] 112 msec wo pre excitation  Recent Labs: 10/14/2016: ALT 16; BUN 13; Creatinine, Ser 0.83; Hemoglobin 12.7; Platelets 310; Potassium 3.9; Sodium 138; TSH 1.380  Recent Lipid Panel No results found for: CHOL, TRIG, HDL, CHOLHDL, VLDL, LDLCALC, LDLDIRECT  Physical Exam:    VS:  BP 136/80 (BP Location: Left Arm, Patient Position: Sitting)   Pulse 86   Ht 5\' 2"  (1.575 m)   Wt 120 lb (54.4 kg)   BMI 21.95 kg/m     Wt Readings from Last 3 Encounters:  03/26/17 120 lb (54.4 kg)  10/21/16 112 lb (50.8 kg)  10/14/16 110 lb (49.9 kg)     GEN:  Well nourished, well developed in no acute distress HEENT: Normal NECK: No JVD; No carotid bruits LYMPHATICS: No lymphadenopathy CARDIAC: RRR, no murmurs, rubs, gallops RESPIRATORY:  Clear to auscultation without rales, wheezing or rhonchi  ABDOMEN: Soft, non-tender, non-distended MUSCULOSKELETAL:  No edema; No deformity  SKIN: Warm and dry NEUROLOGIC:  Alert and oriented x 3 PSYCHIATRIC:  Normal affect     Signed, Norman HerrlichBrian Khadim Lundberg, MD  03/26/2017 1:05 PM    [facility]

## 2017-03-26 ENCOUNTER — Ambulatory Visit (INDEPENDENT_AMBULATORY_CARE_PROVIDER_SITE_OTHER): Payer: BLUE CROSS/BLUE SHIELD | Admitting: Cardiology

## 2017-03-26 ENCOUNTER — Encounter: Payer: Self-pay | Admitting: Cardiology

## 2017-03-26 DIAGNOSIS — I456 Pre-excitation syndrome: Secondary | ICD-10-CM

## 2017-03-26 DIAGNOSIS — R002 Palpitations: Secondary | ICD-10-CM | POA: Diagnosis not present

## 2017-03-26 NOTE — Patient Instructions (Addendum)
Medication Instructions:  Your physician recommends that you continue on your current medications as directed. Please refer to the Current Medication list given to you today.  Labwork: None  Testing/Procedures: You had an EKG today.  Your physician has requested that you have an echocardiogram. Echocardiography is a painless test that uses sound waves to create images of your heart. It provides your doctor with information about the size and shape of your heart and how well your heart's chambers and valves are working. This procedure takes approximately one hour. There are no restrictions for this procedure.  Your physician has recommended that you wear a holter monitor. Holter monitors are medical devices that record the heart's electrical activity. Doctors most often use these monitors to diagnose arrhythmias. Arrhythmias are problems with the speed or rhythm of the heartbeat. The monitor is a small, portable device. You can wear one while you do your normal daily activities. This is usually used to diagnose what is causing palpitations/syncope (passing out). 48 hours.  Follow-Up: Your physician recommends that you schedule a follow-up appointment in: 3 weeks.  Any Other Special Instructions Will Be Listed Below (If Applicable).     If you need a refill on your cardiac medications before your next appointment, please call your pharmacy.    1. Avoid all over-the-counter antihistamines except Claritin/Loratadine and Zyrtec/Cetrizine. 2. Avoid all combination including cold sinus allergies flu decongestant and sleep medications 3. You can use Robitussin DM Mucinex and Mucinex DM for cough. 4. can use Tylenol aspirin ibuprofen and naproxen but no combinations such as sleep or sinus.

## 2017-04-08 ENCOUNTER — Ambulatory Visit (HOSPITAL_BASED_OUTPATIENT_CLINIC_OR_DEPARTMENT_OTHER): Payer: BLUE CROSS/BLUE SHIELD

## 2017-04-16 ENCOUNTER — Ambulatory Visit: Payer: BLUE CROSS/BLUE SHIELD | Admitting: Cardiology

## 2017-06-08 DIAGNOSIS — R69 Illness, unspecified: Secondary | ICD-10-CM | POA: Diagnosis not present

## 2017-07-20 DIAGNOSIS — R69 Illness, unspecified: Secondary | ICD-10-CM | POA: Diagnosis not present

## 2017-11-02 DIAGNOSIS — H5200 Hypermetropia, unspecified eye: Secondary | ICD-10-CM | POA: Diagnosis not present

## 2017-12-16 DIAGNOSIS — L814 Other melanin hyperpigmentation: Secondary | ICD-10-CM | POA: Diagnosis not present

## 2017-12-16 DIAGNOSIS — D1801 Hemangioma of skin and subcutaneous tissue: Secondary | ICD-10-CM | POA: Diagnosis not present

## 2017-12-16 DIAGNOSIS — D225 Melanocytic nevi of trunk: Secondary | ICD-10-CM | POA: Diagnosis not present

## 2017-12-16 DIAGNOSIS — D2272 Melanocytic nevi of left lower limb, including hip: Secondary | ICD-10-CM | POA: Diagnosis not present

## 2017-12-16 DIAGNOSIS — L821 Other seborrheic keratosis: Secondary | ICD-10-CM | POA: Diagnosis not present

## 2018-01-29 DIAGNOSIS — H9312 Tinnitus, left ear: Secondary | ICD-10-CM | POA: Diagnosis not present

## 2018-02-09 DIAGNOSIS — Z01419 Encounter for gynecological examination (general) (routine) without abnormal findings: Secondary | ICD-10-CM | POA: Diagnosis not present

## 2018-02-09 DIAGNOSIS — Z6823 Body mass index (BMI) 23.0-23.9, adult: Secondary | ICD-10-CM | POA: Diagnosis not present

## 2018-02-09 DIAGNOSIS — Z1231 Encounter for screening mammogram for malignant neoplasm of breast: Secondary | ICD-10-CM | POA: Diagnosis not present

## 2018-03-12 DIAGNOSIS — N760 Acute vaginitis: Secondary | ICD-10-CM | POA: Diagnosis not present

## 2018-05-13 ENCOUNTER — Encounter (HOSPITAL_BASED_OUTPATIENT_CLINIC_OR_DEPARTMENT_OTHER): Payer: Self-pay | Admitting: Emergency Medicine

## 2018-05-13 ENCOUNTER — Emergency Department (HOSPITAL_BASED_OUTPATIENT_CLINIC_OR_DEPARTMENT_OTHER)
Admission: EM | Admit: 2018-05-13 | Discharge: 2018-05-13 | Disposition: A | Discharge status: Home / Self Care | Payer: 59 | Attending: Emergency Medicine | Admitting: Emergency Medicine

## 2018-05-13 ENCOUNTER — Other Ambulatory Visit: Payer: Self-pay

## 2018-05-13 ENCOUNTER — Emergency Department (HOSPITAL_BASED_OUTPATIENT_CLINIC_OR_DEPARTMENT_OTHER): Payer: 59

## 2018-05-13 DIAGNOSIS — E876 Hypokalemia: Secondary | ICD-10-CM | POA: Diagnosis not present

## 2018-05-13 DIAGNOSIS — J111 Influenza due to unidentified influenza virus with other respiratory manifestations: Secondary | ICD-10-CM | POA: Diagnosis not present

## 2018-05-13 DIAGNOSIS — R55 Syncope and collapse: Secondary | ICD-10-CM | POA: Insufficient documentation

## 2018-05-13 DIAGNOSIS — E860 Dehydration: Secondary | ICD-10-CM | POA: Insufficient documentation

## 2018-05-13 DIAGNOSIS — R6889 Other general symptoms and signs: Secondary | ICD-10-CM

## 2018-05-13 DIAGNOSIS — W19XXXA Unspecified fall, initial encounter: Secondary | ICD-10-CM | POA: Diagnosis not present

## 2018-05-13 DIAGNOSIS — I959 Hypotension, unspecified: Secondary | ICD-10-CM | POA: Diagnosis not present

## 2018-05-13 LAB — URINALYSIS, MICROSCOPIC (REFLEX)

## 2018-05-13 LAB — URINALYSIS, ROUTINE W REFLEX MICROSCOPIC
Bilirubin Urine: NEGATIVE
Glucose, UA: NEGATIVE mg/dL
KETONES UR: 15 mg/dL — AB
LEUKOCYTES UA: NEGATIVE
Nitrite: NEGATIVE
Protein, ur: NEGATIVE mg/dL
Specific Gravity, Urine: 1.025 (ref 1.005–1.030)
pH: 6 (ref 5.0–8.0)

## 2018-05-13 LAB — CBC WITH DIFFERENTIAL/PLATELET
ABS IMMATURE GRANULOCYTES: 0.01 10*3/uL (ref 0.00–0.07)
BASOS ABS: 0 10*3/uL (ref 0.0–0.1)
Basophils Relative: 0 %
Eosinophils Absolute: 0 10*3/uL (ref 0.0–0.5)
Eosinophils Relative: 0 %
HCT: 42.1 % (ref 36.0–46.0)
Hemoglobin: 12.9 g/dL (ref 12.0–15.0)
IMMATURE GRANULOCYTES: 0 %
LYMPHS PCT: 14 %
Lymphs Abs: 0.9 10*3/uL (ref 0.7–4.0)
MCH: 27 pg (ref 26.0–34.0)
MCHC: 30.6 g/dL (ref 30.0–36.0)
MCV: 88.1 fL (ref 80.0–100.0)
Monocytes Absolute: 0.6 10*3/uL (ref 0.1–1.0)
Monocytes Relative: 9 %
NEUTROS ABS: 4.7 10*3/uL (ref 1.7–7.7)
NRBC: 0 % (ref 0.0–0.2)
Neutrophils Relative %: 77 %
Platelets: 230 10*3/uL (ref 150–400)
RBC: 4.78 MIL/uL (ref 3.87–5.11)
RDW: 14.6 % (ref 11.5–15.5)
WBC: 6.1 10*3/uL (ref 4.0–10.5)

## 2018-05-13 LAB — COMPREHENSIVE METABOLIC PANEL
ALBUMIN: 3.8 g/dL (ref 3.5–5.0)
ALT: 14 U/L (ref 0–44)
AST: 22 U/L (ref 15–41)
Alkaline Phosphatase: 48 U/L (ref 38–126)
Anion gap: 5 (ref 5–15)
BUN: 12 mg/dL (ref 6–20)
CHLORIDE: 106 mmol/L (ref 98–111)
CO2: 25 mmol/L (ref 22–32)
Calcium: 8.4 mg/dL — ABNORMAL LOW (ref 8.9–10.3)
Creatinine, Ser: 0.92 mg/dL (ref 0.44–1.00)
GFR calc non Af Amer: >60 mL/min (ref >60)
GLUCOSE: 111 mg/dL — AB (ref 70–99)
Potassium: 3.3 mmol/L — ABNORMAL LOW (ref 3.5–5.1)
SODIUM: 136 mmol/L (ref 135–145)
Total Bilirubin: 0.4 mg/dL (ref 0.3–1.2)
Total Protein: 7 g/dL (ref 6.5–8.1)

## 2018-05-13 LAB — TROPONIN I: Troponin I: <0.03 ng/mL (ref <0.03)

## 2018-05-13 LAB — PREGNANCY, URINE: PREG TEST UR: NEGATIVE

## 2018-05-13 MED ORDER — POTASSIUM CHLORIDE CRYS ER 20 MEQ PO TBCR: 40.0000 meq | EXTENDED_RELEASE_TABLET | Freq: Every day | ORAL | 0 refills | Status: DC

## 2018-05-13 MED ORDER — SODIUM CHLORIDE 0.9 % IV BOLUS
1000.0000 mL | Freq: Once | INTRAVENOUS | Status: AC
  Administered 2018-05-13: 1000 mL via INTRAVENOUS

## 2018-05-13 NOTE — ED Notes (Signed)
ED Provider at bedside. 

## 2018-05-13 NOTE — ED Triage Notes (Signed)
[facility] from home-[facility] yesterday.  Given tamiflu.  Reports she continues to feel bad and had 2 syncopal episodes this morning.

## 2018-05-13 NOTE — Discharge Instructions (Signed)

## 2018-05-13 NOTE — ED Notes (Signed)
Pt ambulated well in the department --pulse 120 --- SpO2 ranged 95% to 100% while walking.

## 2018-05-13 NOTE — ED Provider Notes (Signed)
Emergency Department Provider Note   I have reviewed the triage vital signs and the nursing notes.   HISTORY  Chief Complaint Loss of Consciousness   HPI Kelly Huffman is a 43 y.o. female with PMH of GERD resents to the emergency department with flulike symptoms and syncope this morning.  The patient has had body aches, fevers, chills, URI symptoms the past 3 to 4 days.  She was seen in urgent care yesterday and started on Tamiflu.  She took the first dose last night without complication.  She has developed some diarrhea but no nausea or vomiting.  She states she is trying to eat and drink but is having to force herself to do so because she does not feel well.  She got up this morning and started to walk to the kitchen.  She recalls feeling nauseated and lightheaded "like I got up too fast" and then does not remember anything afterwards.  Her husband reported that she had a syncope event followed in short order by a second event.  The patient denies any heart palpitations, shortness of breath, chest pain either before or after the events.  No abdominal discomfort.  No sudden onset headache.  Past Medical History:  Diagnosis Date  . GERD (gastroesophageal reflux disease)   . Plantar fasciitis 09/21/2016    Patient Active Problem List   Diagnosis Date Noted  . Short PR-normal QRS complex syndrome 03/26/2017  . Palpitation 03/25/2017  . Plantar fasciitis 09/21/2016    Past Surgical History:  Procedure Laterality Date  . CESAREAN SECTION    . TUBAL LIGATION  2006   Allergies Cortisone  Family History  Problem Relation Age of Onset  . Heart attack Father   . Heart disease Paternal Uncle   . Heart attack Paternal Grandfather     Social History Social History   Tobacco Use  . Smoking status: Never Smoker  . Smokeless tobacco: Never Used  Substance Use Topics  . Alcohol use: No  . Drug use: No    Review of Systems  Constitutional: Positive fever and body  aches.  Eyes: No visual changes. ENT: Positive sore throat. Cardiovascular: Denies chest pain. Positive syncope.  Respiratory: Denies shortness of breath. Positive cough.  Gastrointestinal: No abdominal pain. Positive nausea, no vomiting. Positive diarrhea.  No constipation. Genitourinary: Negative for dysuria. Musculoskeletal: Negative for back pain. Skin: Negative for rash. Neurological: Negative for focal weakness or numbness. Mild HA occasionally.   10-point ROS otherwise negative.  ____________________________________________   PHYSICAL EXAM:  VITAL SIGNS: ED Triage Vitals [05/13/18 0736]  Enc Vitals Group     BP 116/69     Pulse Rate 79     Resp 20     Temp 98 F (36.7 C)     Temp Source Oral     SpO2 100 %     Weight 125 lb (56.7 kg)     Height 5\' 2"  (1.575 m)     Pain Score 5   Constitutional: Alert and oriented. Well appearing and in no acute distress. Eyes: Conjunctivae are normal. PERRL. Head: Atraumatic. Nose: Positive congestion/rhinnorhea. Mouth/Throat: Mucous membranes are slightly dry.  Oropharynx with erythema. No exudate. No PTA.  Neck: No stridor.  Cardiovascular: Normal rate, regular rhythm. Good peripheral circulation. Grossly normal heart sounds.   Respiratory: Normal respiratory effort.  No retractions. Lungs CTAB. Gastrointestinal: Soft and nontender. No distention.  Musculoskeletal: No lower extremity tenderness nor edema. No gross deformities of extremities. Neurologic:  Normal speech  and language. No gross focal neurologic deficits are appreciated.  Skin:  Skin is warm, dry and intact. No rash noted.  ____________________________________________   LABS (all labs ordered are listed, but only abnormal results are displayed)  Labs Reviewed  COMPREHENSIVE METABOLIC PANEL - Abnormal; Notable for the following components:      Result Value   Potassium 3.3 (*)    Glucose, Bld 111 (*)    Calcium 8.4 (*)    All other components within normal  limits  URINALYSIS, ROUTINE W REFLEX MICROSCOPIC - Abnormal; Notable for the following components:   APPearance HAZY (*)    Hgb urine dipstick TRACE (*)    Ketones, ur 15 (*)    All other components within normal limits  URINALYSIS, MICROSCOPIC (REFLEX) - Abnormal; Notable for the following components:   Bacteria, UA FEW (*)    All other components within normal limits  TROPONIN I  CBC WITH DIFFERENTIAL/PLATELET  PREGNANCY, URINE   ____________________________________________  EKG   EKG Interpretation  Date/Time:  Thursday May 13 2018 07:40:37 EST Ventricular Rate:  71 PR Interval:    QRS Duration: 83 QT Interval:  394 QTC Calculation: 429 R Axis:   76 Text Interpretation:  Sinus rhythm Short PR interval Abnormal R-wave progression, early transition Baseline wander in lead(s) V5 No STEMI.  Confirmed by Alona Bene [id number]) on 05/13/2018 8:03:33 AM       ____________________________________________  RADIOLOGY  Dg Chest 2 View  Result Date: 05/13/2018 CLINICAL DATA:  Flu like symptoms with syncope. EXAM: CHEST - 2 VIEW COMPARISON:  None. FINDINGS: Normal heart size and mediastinal contours. No acute infiltrate or edema. No effusion or pneumothorax. No acute osseous findings. IMPRESSION: Negative chest. Electronically Signed   By: Marnee Spring M.D.   On: 05/13/2018 08:21    ____________________________________________   PROCEDURES  Procedure(s) performed:   Procedures  None ____________________________________________   INITIAL IMPRESSION / ASSESSMENT AND PLAN / ED COURSE  Pertinent labs & imaging results that were available during my care of the patient were reviewed by me and considered in my medical decision making (see chart for details).  Patient presents to the emergency department for evaluation of syncope this morning in the setting of flulike symptoms.  Patient appears moderately dehydrated and fatigue likely from flu.  She has started Tamiflu.   Her vital signs are unremarkable including heart rate, blood pressure, oxygen saturation on room air.  Her EKG was reviewed with no clear arrhythmia or ischemic changes.  My suspicion for cardiogenic syncope is very low.  Plan for IV fluids, screening labs, chest x-ray to rule out pneumonia, reassess.  09:30 AM Orthostatic vitals are unremarkable.  Lab work shows mild hypokalemia no other acute findings.  Chest x-ray reviewed with no acute findings.  Patient ambulatory in the emergency department without symptoms.  She does have some tachycardia while ambulating though I do plan for additional IV fluid bolus.  Patient was asymptomatic with her tachycardia and this resolved with rest.  Husband is now at bedside who describes what he witnessed at home.  This seems most consistent with noncardiac syncope.  Discussed management at home and ED return precautions with both the patient and husband at bedside.  Will follow with the PCP who can refer to cardiology as needed. Both are comfortable with additional IVF and discharge and verbalize understanding of discharge instructions.  ____________________________________________  FINAL CLINICAL IMPRESSION(S) / ED DIAGNOSES  Final diagnoses:  Syncope and collapse  Dehydration  Flu-like symptoms  Hypokalemia     MEDICATIONS GIVEN DURING THIS VISIT:  Medications  sodium chloride 0.9 % bolus 1,000 mL (1,000 mLs Intravenous New Bag/Given 05/13/18 0944)     NEW OUTPATIENT MEDICATIONS STARTED DURING THIS VISIT:  New Prescriptions   POTASSIUM CHLORIDE SA (K-DUR,KLOR-CON) 20 MEQ TABLET    Take 2 tablets (40 mEq total) by mouth daily for 4 days.    Note:  This document was prepared using Dragon voice recognition software and may include unintentional dictation errors.  Alona Bene, MD Emergency Medicine    Long, Arlyss Repress, MD 05/13/18 [phone]

## 2018-10-14 DIAGNOSIS — Z20828 Contact with and (suspected) exposure to other viral communicable diseases: Secondary | ICD-10-CM | POA: Diagnosis not present

## 2018-10-15 DIAGNOSIS — Z20828 Contact with and (suspected) exposure to other viral communicable diseases: Secondary | ICD-10-CM | POA: Diagnosis not present

## 2018-11-01 DIAGNOSIS — D2272 Melanocytic nevi of left lower limb, including hip: Secondary | ICD-10-CM | POA: Diagnosis not present

## 2018-11-01 DIAGNOSIS — D485 Neoplasm of uncertain behavior of skin: Secondary | ICD-10-CM | POA: Diagnosis not present

## 2019-02-15 DIAGNOSIS — L814 Other melanin hyperpigmentation: Secondary | ICD-10-CM | POA: Diagnosis not present

## 2019-02-15 DIAGNOSIS — D2272 Melanocytic nevi of left lower limb, including hip: Secondary | ICD-10-CM | POA: Diagnosis not present

## 2019-02-15 DIAGNOSIS — D225 Melanocytic nevi of trunk: Secondary | ICD-10-CM | POA: Diagnosis not present

## 2019-02-15 DIAGNOSIS — L821 Other seborrheic keratosis: Secondary | ICD-10-CM | POA: Diagnosis not present

## 2019-02-15 DIAGNOSIS — Z23 Encounter for immunization: Secondary | ICD-10-CM | POA: Diagnosis not present

## 2019-03-21 DIAGNOSIS — Z1231 Encounter for screening mammogram for malignant neoplasm of breast: Secondary | ICD-10-CM | POA: Diagnosis not present

## 2019-03-21 DIAGNOSIS — Z6824 Body mass index (BMI) 24.0-24.9, adult: Secondary | ICD-10-CM | POA: Diagnosis not present

## 2019-03-21 DIAGNOSIS — Z01419 Encounter for gynecological examination (general) (routine) without abnormal findings: Secondary | ICD-10-CM | POA: Diagnosis not present

## 2020-01-10 DIAGNOSIS — Z23 Encounter for immunization: Secondary | ICD-10-CM | POA: Diagnosis not present

## 2020-02-20 DIAGNOSIS — L578 Other skin changes due to chronic exposure to nonionizing radiation: Secondary | ICD-10-CM | POA: Diagnosis not present

## 2020-02-20 DIAGNOSIS — D225 Melanocytic nevi of trunk: Secondary | ICD-10-CM | POA: Diagnosis not present

## 2020-02-20 DIAGNOSIS — D2272 Melanocytic nevi of left lower limb, including hip: Secondary | ICD-10-CM | POA: Diagnosis not present

## 2020-02-20 DIAGNOSIS — L648 Other androgenic alopecia: Secondary | ICD-10-CM | POA: Diagnosis not present

## 2020-03-14 DIAGNOSIS — G8929 Other chronic pain: Secondary | ICD-10-CM | POA: Diagnosis not present

## 2020-03-14 DIAGNOSIS — M533 Sacrococcygeal disorders, not elsewhere classified: Secondary | ICD-10-CM | POA: Diagnosis not present

## 2020-03-14 DIAGNOSIS — Z6823 Body mass index (BMI) 23.0-23.9, adult: Secondary | ICD-10-CM | POA: Diagnosis not present

## 2020-03-14 DIAGNOSIS — Z13220 Encounter for screening for lipoid disorders: Secondary | ICD-10-CM | POA: Diagnosis not present

## 2020-03-14 DIAGNOSIS — Z0000 Encounter for general adult medical examination without abnormal findings: Secondary | ICD-10-CM | POA: Diagnosis not present

## 2020-03-14 DIAGNOSIS — R1013 Epigastric pain: Secondary | ICD-10-CM | POA: Diagnosis not present

## 2020-05-24 ENCOUNTER — Other Ambulatory Visit: Payer: Self-pay | Admitting: Obstetrics and Gynecology

## 2020-05-24 DIAGNOSIS — R928 Other abnormal and inconclusive findings on diagnostic imaging of breast: Secondary | ICD-10-CM

## 2020-05-29 ENCOUNTER — Other Ambulatory Visit: Payer: Self-pay

## 2020-05-29 ENCOUNTER — Ambulatory Visit
Admission: RE | Admit: 2020-05-29 | Discharge: 2020-05-29 | Disposition: A | Discharge status: Home / Self Care | Payer: No Typology Code available for payment source | Source: Ambulatory Visit | Attending: Obstetrics and Gynecology | Admitting: Obstetrics and Gynecology

## 2020-05-29 ENCOUNTER — Ambulatory Visit: Payer: 59

## 2020-05-29 ENCOUNTER — Ambulatory Visit
Admission: RE | Admit: 2020-05-29 | Discharge: 2020-05-29 | Disposition: A | Discharge status: Home / Self Care | Payer: 59 | Source: Ambulatory Visit | Attending: Obstetrics and Gynecology | Admitting: Obstetrics and Gynecology

## 2020-05-29 DIAGNOSIS — R928 Other abnormal and inconclusive findings on diagnostic imaging of breast: Secondary | ICD-10-CM

## 2020-06-07 ENCOUNTER — Other Ambulatory Visit: Payer: 59

## 2021-04-14 ENCOUNTER — Emergency Department (INDEPENDENT_AMBULATORY_CARE_PROVIDER_SITE_OTHER)
Admission: EM | Admit: 2021-04-14 | Discharge: 2021-04-14 | Disposition: A | Discharge status: Home / Self Care | Payer: No Typology Code available for payment source | Source: Home / Self Care | Attending: Family Medicine | Admitting: Family Medicine

## 2021-04-14 ENCOUNTER — Encounter: Payer: Self-pay | Admitting: Emergency Medicine

## 2021-04-14 ENCOUNTER — Other Ambulatory Visit: Payer: Self-pay

## 2021-04-14 DIAGNOSIS — H6982 Other specified disorders of Eustachian tube, left ear: Secondary | ICD-10-CM | POA: Diagnosis not present

## 2021-04-14 DIAGNOSIS — H6692 Otitis media, unspecified, left ear: Secondary | ICD-10-CM

## 2021-04-14 MED ORDER — AMOXICILLIN 875 MG PO TABS: 875.0000 mg | ORAL_TABLET | Freq: Two times a day (BID) | ORAL | 0 refills | Status: DC

## 2021-04-14 MED ORDER — AZELASTINE HCL 0.1 % NA SOLN: 1.0000 | Freq: Two times a day (BID) | NASAL | 12 refills | Status: DC

## 2021-04-14 NOTE — ED Triage Notes (Signed)
Left ear fullness since Thursday  Ear wax drops on Fri &  Sat  No drainage noted  No hx of impaction

## 2021-04-14 NOTE — Discharge Instructions (Signed)
Drink lots of fluids Take antibiotic 2 times a day for 5 days, until symptoms improve Use nasal spray twice a day until ear pain improves

## 2021-04-14 NOTE — ED Provider Notes (Signed)
[facility]    CSN: [id number] Arrival date & time: 04/14/21  1101      History   Chief Complaint Chief Complaint  Patient presents with   Ear Fullness    left    HPI Kelly Huffman is a 46 y.o. female.   HPI  Patient's left ear has been uncomfortable for the last 4 days.  No cough or cold symptoms.  No fever.  Diminished hearing.  Some pain  Past Medical History:  Diagnosis Date   GERD (gastroesophageal reflux disease)    Plantar fasciitis 09/21/2016    Patient Active Problem List   Diagnosis Date Noted   Short PR-normal QRS complex syndrome 03/26/2017   Palpitation 03/25/2017   Plantar fasciitis 09/21/2016    Past Surgical History:  Procedure Laterality Date   CESAREAN SECTION     TUBAL LIGATION  2006    OB History   No obstetric history on file.      Home Medications    Prior to Admission medications   Medication Sig Start Date End Date Taking? Authorizing Provider  amoxicillin (AMOXIL) 875 MG tablet Take 1 tablet (875 mg total) by mouth 2 (two) times daily. 04/14/21  Yes Raylene Everts, MD  azelastine (ASTELIN) 0.1 % nasal spray Place 1 spray into both nostrils 2 (two) times daily. 04/14/21  Yes Raylene Everts, MD    Family History Family History  Problem Relation Age of Onset   Diabetes Mother    Bipolar disorder Mother    Heart attack Father    Heart attack Paternal Grandfather    Heart disease Paternal Uncle     Social History Social History   Tobacco Use   Smoking status: Never    Passive exposure: Never   Smokeless tobacco: Never  Vaping Use   Vaping Use: Never used  Substance Use Topics   Alcohol use: No   Drug use: No     Allergies   Cortisone   Review of Systems Review of Systems See HPI  Physical Exam Triage Vital Signs ED Triage Vitals  Enc Vitals Group     BP 04/14/21 1214 (!) 156/89     Pulse Rate 04/14/21 1214 65     Resp 04/14/21 1214 15     Temp 04/14/21 1214 98.2 F (36.8 C)      Temp Source 04/14/21 1214 Oral     SpO2 04/14/21 1214 100 %     Weight 04/14/21 1216 130 lb (59 kg)     Height 04/14/21 1216 5\' 2"  (1.575 m)     Head Circumference --      Peak Flow --      Pain Score 04/14/21 1215 2     Pain Loc --      Pain Edu? --      Excl. in [place]? --    No data found.  Updated Vital Signs BP (!) 156/89 (BP Location: Right Arm) Comment: no hx of HTN per pt   Pulse 65    Temp 98.2 F (36.8 C) (Oral)    Resp 15    Ht 5\' 2"  (1.575 m)    Wt 59 kg    LMP 03/25/2021 (Approximate)    SpO2 100%    BMI 23.78 kg/m   Physical Exam Constitutional:      General: She is not in acute distress.    Appearance: She is well-developed.  HENT:     Head: Normocephalic and atraumatic.  Right Ear: Tympanic membrane, ear canal and external ear normal.     Left Ear: Ear canal and external ear normal.     Ears:     Comments: Left TM is dull.  Erythema peripherally    Mouth/Throat:     Mouth: Mucous membranes are moist.     Pharynx: No posterior oropharyngeal erythema.  Eyes:     Conjunctiva/sclera: Conjunctivae normal.     Pupils: Pupils are equal, round, and reactive to light.  Cardiovascular:     Rate and Rhythm: Normal rate.  Pulmonary:     Effort: Pulmonary effort is normal. No respiratory distress.  Abdominal:     General: There is no distension.     Palpations: Abdomen is soft.  Musculoskeletal:        General: Normal range of motion.     Cervical back: Normal range of motion.  Skin:    General: Skin is warm and dry.  Neurological:     Mental Status: She is alert.  Psychiatric:        Mood and Affect: Mood normal.        Behavior: Behavior normal.     UC Treatments / Results  Labs (all labs ordered are listed, but only abnormal results are displayed) Labs Reviewed - No data to display  EKG   Radiology No results found.  Procedures Procedures (including critical care time)  Medications Ordered in UC Medications - No data to display  Initial  Impression / Assessment and Plan / UC Course  I have reviewed the triage vital signs and the nursing notes.  Pertinent labs & imaging results that were available during my care of the patient were reviewed by me and considered in my medical decision making (see chart for details).     Final Clinical Impressions(s) / UC Diagnoses   Final diagnoses:  Acute left otitis media  Eustachian tube dysfunction, left     Discharge Instructions      Drink lots of fluids Take antibiotic 2 times a day for 5 days, until symptoms improve Use nasal spray twice a day until ear pain improves    ED Prescriptions     Medication Sig Dispense Auth. Provider   amoxicillin (AMOXIL) 875 MG tablet Take 1 tablet (875 mg total) by mouth 2 (two) times daily. 14 tablet Raylene Everts, MD   azelastine (ASTELIN) 0.1 % nasal spray Place 1 spray into both nostrils 2 (two) times daily. 30 mL Raylene Everts, MD      PDMP not reviewed this encounter.   Raylene Everts, MD 04/14/21 [phone]

## 2021-08-21 ENCOUNTER — Other Ambulatory Visit: Payer: Self-pay | Admitting: Obstetrics and Gynecology

## 2021-08-21 DIAGNOSIS — R928 Other abnormal and inconclusive findings on diagnostic imaging of breast: Secondary | ICD-10-CM

## 2021-08-28 ENCOUNTER — Ambulatory Visit
Admission: RE | Admit: 2021-08-28 | Discharge: 2021-08-28 | Disposition: A | Discharge status: Home / Self Care | Payer: No Typology Code available for payment source | Source: Ambulatory Visit | Attending: Obstetrics and Gynecology | Admitting: Obstetrics and Gynecology

## 2021-08-28 DIAGNOSIS — R928 Other abnormal and inconclusive findings on diagnostic imaging of breast: Secondary | ICD-10-CM

## 2021-08-30 ENCOUNTER — Other Ambulatory Visit: Payer: No Typology Code available for payment source

## 2022-11-22 ENCOUNTER — Ambulatory Visit
Admission: EM | Admit: 2022-11-22 | Discharge: 2022-11-22 | Disposition: A | Discharge status: Home / Self Care | Payer: No Typology Code available for payment source

## 2022-11-22 ENCOUNTER — Ambulatory Visit (INDEPENDENT_AMBULATORY_CARE_PROVIDER_SITE_OTHER): Payer: No Typology Code available for payment source

## 2022-11-22 ENCOUNTER — Encounter: Payer: Self-pay | Admitting: Emergency Medicine

## 2022-11-22 DIAGNOSIS — S50311A Abrasion of right elbow, initial encounter: Secondary | ICD-10-CM

## 2022-11-22 DIAGNOSIS — S5001XA Contusion of right elbow, initial encounter: Secondary | ICD-10-CM | POA: Diagnosis not present
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
## 2022-11-22 NOTE — Discharge Instructions (Signed)
Use ibuprofen or naproxen for pain See sport medicine or ortho if pain persists

## 2023-02-10 ENCOUNTER — Other Ambulatory Visit: Payer: Self-pay | Admitting: Obstetrics and Gynecology

## 2023-02-10 DIAGNOSIS — R928 Other abnormal and inconclusive findings on diagnostic imaging of breast: Secondary | ICD-10-CM

## 2023-02-25 ENCOUNTER — Ambulatory Visit
Admission: RE | Admit: 2023-02-25 | Discharge: 2023-02-25 | Disposition: A | Discharge status: Home / Self Care | Payer: No Typology Code available for payment source | Source: Ambulatory Visit | Attending: Obstetrics and Gynecology | Admitting: Obstetrics and Gynecology

## 2023-02-25 DIAGNOSIS — R928 Other abnormal and inconclusive findings on diagnostic imaging of breast: Secondary | ICD-10-CM

## 2023-06-01 ENCOUNTER — Other Ambulatory Visit: Payer: Self-pay | Admitting: Family Medicine

## 2023-06-01 DIAGNOSIS — Z8249 Family history of ischemic heart disease and other diseases of the circulatory system: Secondary | ICD-10-CM

## 2023-06-05 ENCOUNTER — Ambulatory Visit (INDEPENDENT_AMBULATORY_CARE_PROVIDER_SITE_OTHER): Payer: Self-pay

## 2023-06-05 DIAGNOSIS — Z8249 Family history of ischemic heart disease and other diseases of the circulatory system: Secondary | ICD-10-CM

## 2024-01-16 IMAGING — US US BREAST*L* LIMITED INC AXILLA
1 series · 10 of 10 positions shown · non-contrast
Comparison: Previous exam(s).

CLINICAL DATA: Patient returns after screening study for evaluation
of possible LEFT breast mass.

EXAM:
DIGITAL DIAGNOSTIC UNILATERAL LEFT MAMMOGRAM WITH TOMOSYNTHESIS AND
CAD; ULTRASOUND LEFT BREAST LIMITED
TECHNIQUE: Left digital diagnostic mammography and breast tomosynthesis was
performed. The images were evaluated with computer-aided detection.;
Targeted ultrasound examination of the left breast was performed.

[Series 1: us breast*left* limited inc axilla · 0.07mm/px · 10 of 10 slices shown]
[im 1/10]
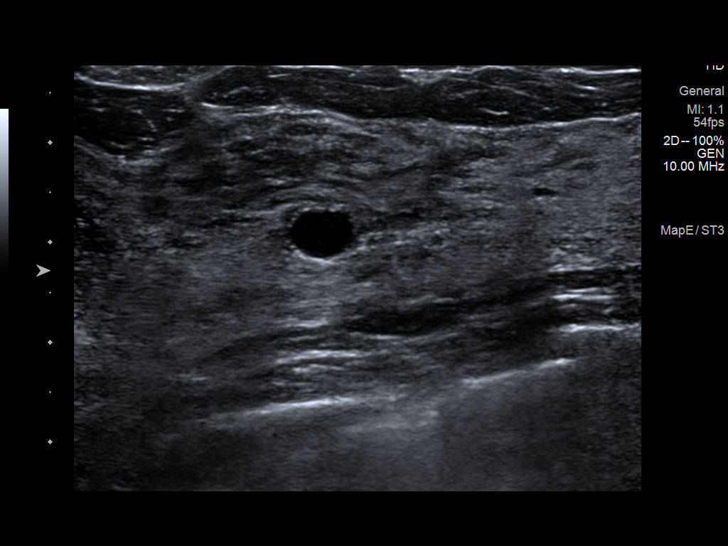
[im 2/10]
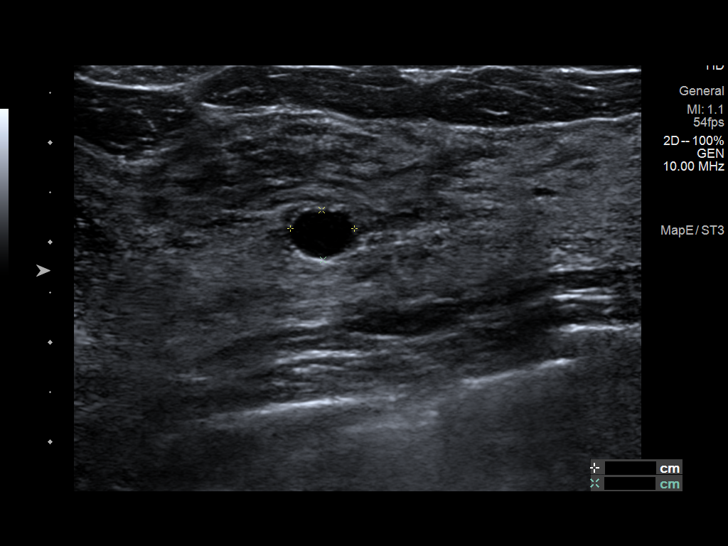
[im 3/10]
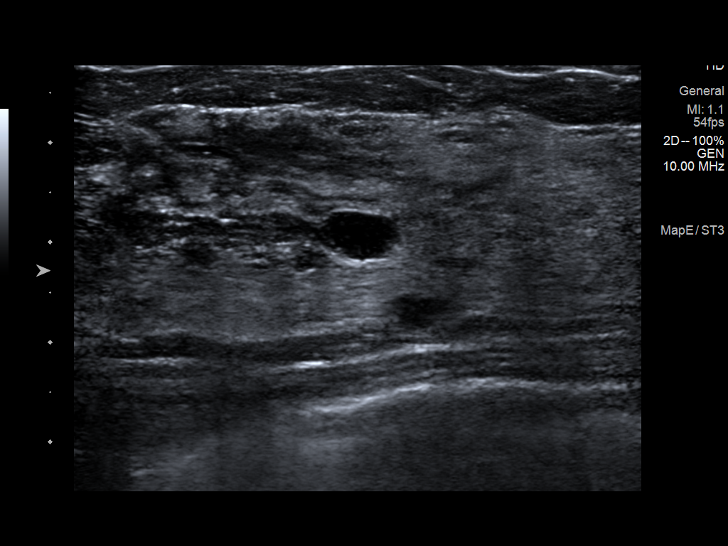
[im 4/10]
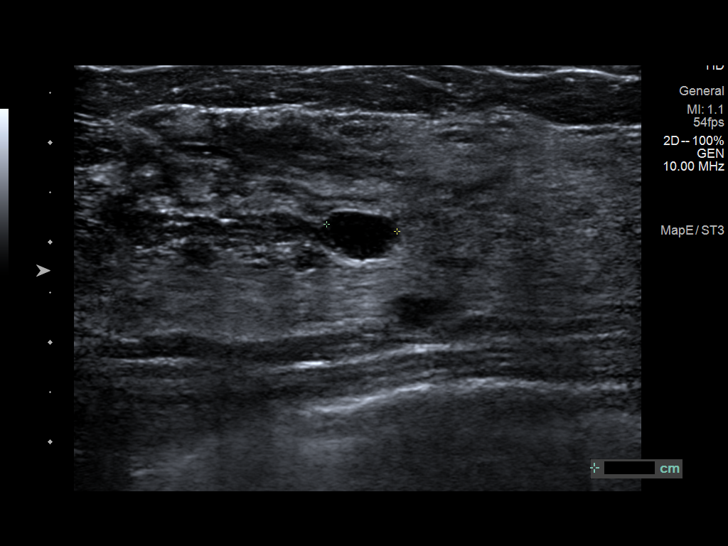
[im 5/10]
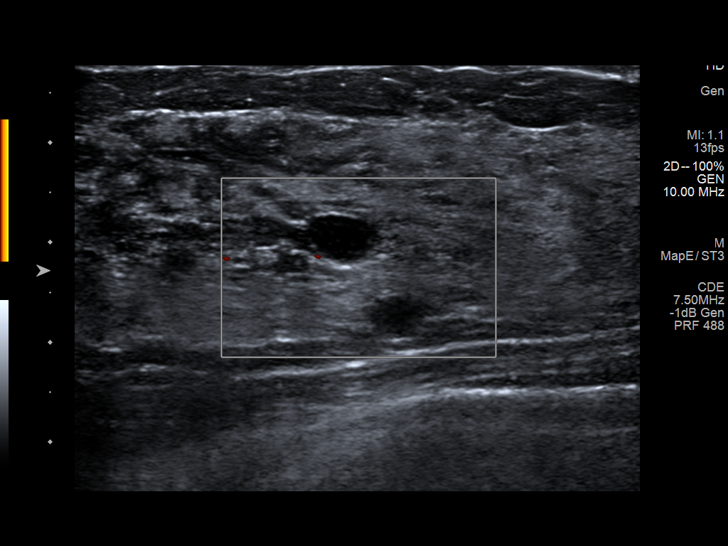
[im 6/10]
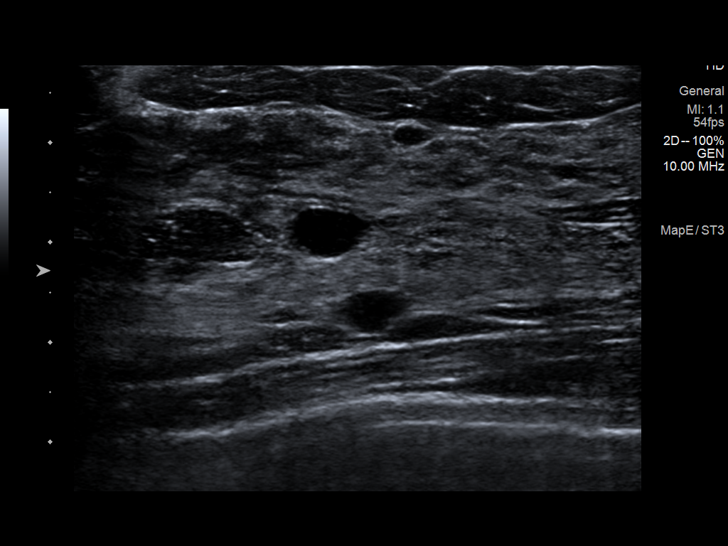
[im 7/10]
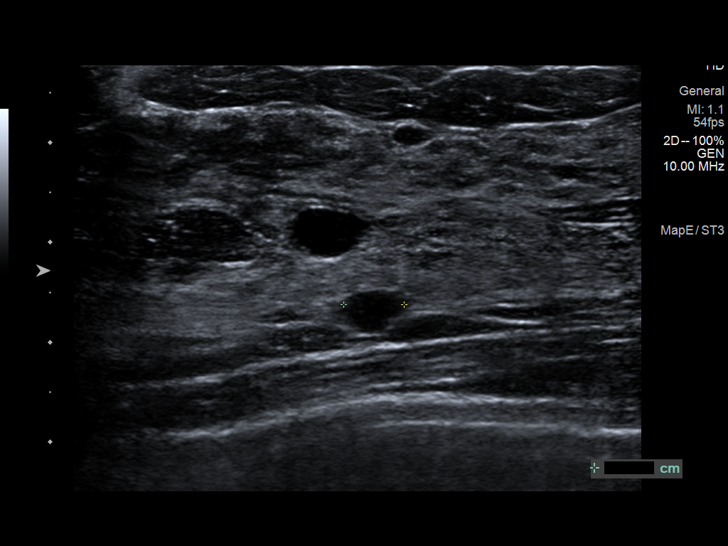
[im 8/10]
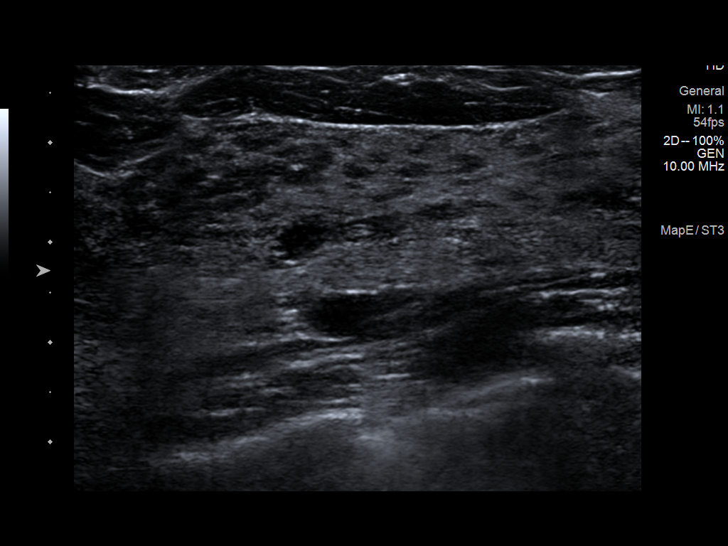
[im 9/10]
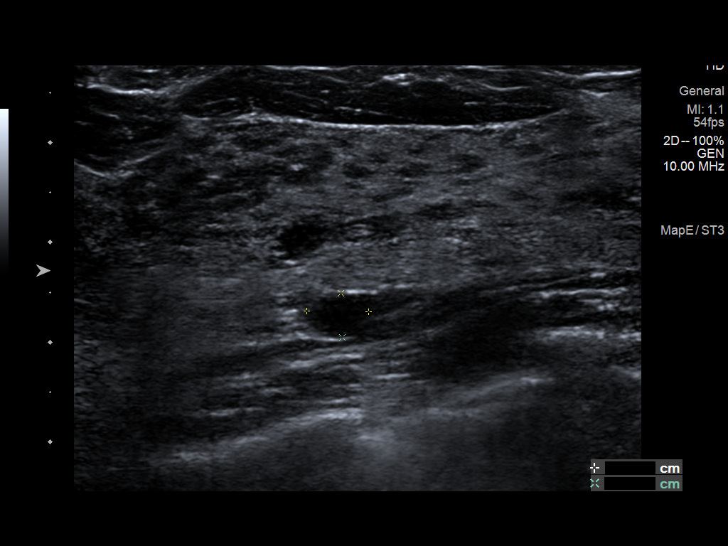
[im 10/10]
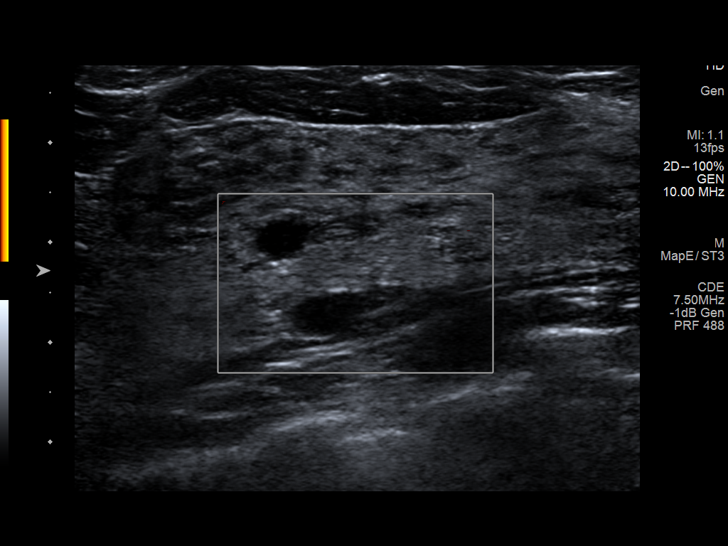

[10 of 10 positions shown; findings below may reference images not displayed]

ACR Breast Density Category c: The breast tissue is heterogeneously
dense, which may obscure small masses.
FINDINGS: Additional 2-D and 3-D images are performed. These views show dense
fibroglandular tissue without discrete mass in the UPPER posterior
aspect of the LEFT breast.

Targeted ultrasound is performed, showing dense fibroglandular
tissue in the UPPER-OUTER QUADRANT of the LEFT breast. Benign cysts
are identified in the 1 o'clock location 3 centimeters from the
nipple, measuring 0.7 is 0.6 centimeters. No solid masses or areas
of acoustic shadowing are identified in this portion of the breast.
IMPRESSION: No mammographic or ultrasound evidence for malignancy.

RECOMMENDATION:
Screening mammogram in one year.(Code:YE-7-125)

I have discussed the findings and recommendations with the patient.
If applicable, a reminder letter will be sent to the patient
regarding the next appointment.

BI-RADS CATEGORY  2: Benign.

## 2024-01-16 IMAGING — MG MM DIGITAL DIAGNOSTIC UNILAT*L* W/ TOMO W/ CAD
4 series · 4 of 12 positions shown · non-contrast
Comparison: Previous exam(s).

CLINICAL DATA: Patient returns after screening study for evaluation
of possible LEFT breast mass.

EXAM:
DIGITAL DIAGNOSTIC UNILATERAL LEFT MAMMOGRAM WITH TOMOSYNTHESIS AND
CAD; ULTRASOUND LEFT BREAST LIMITED
TECHNIQUE: Left digital diagnostic mammography and breast tomosynthesis was
performed. The images were evaluated with computer-aided detection.;
Targeted ultrasound examination of the left breast was performed.

[L MLO synth-2D]
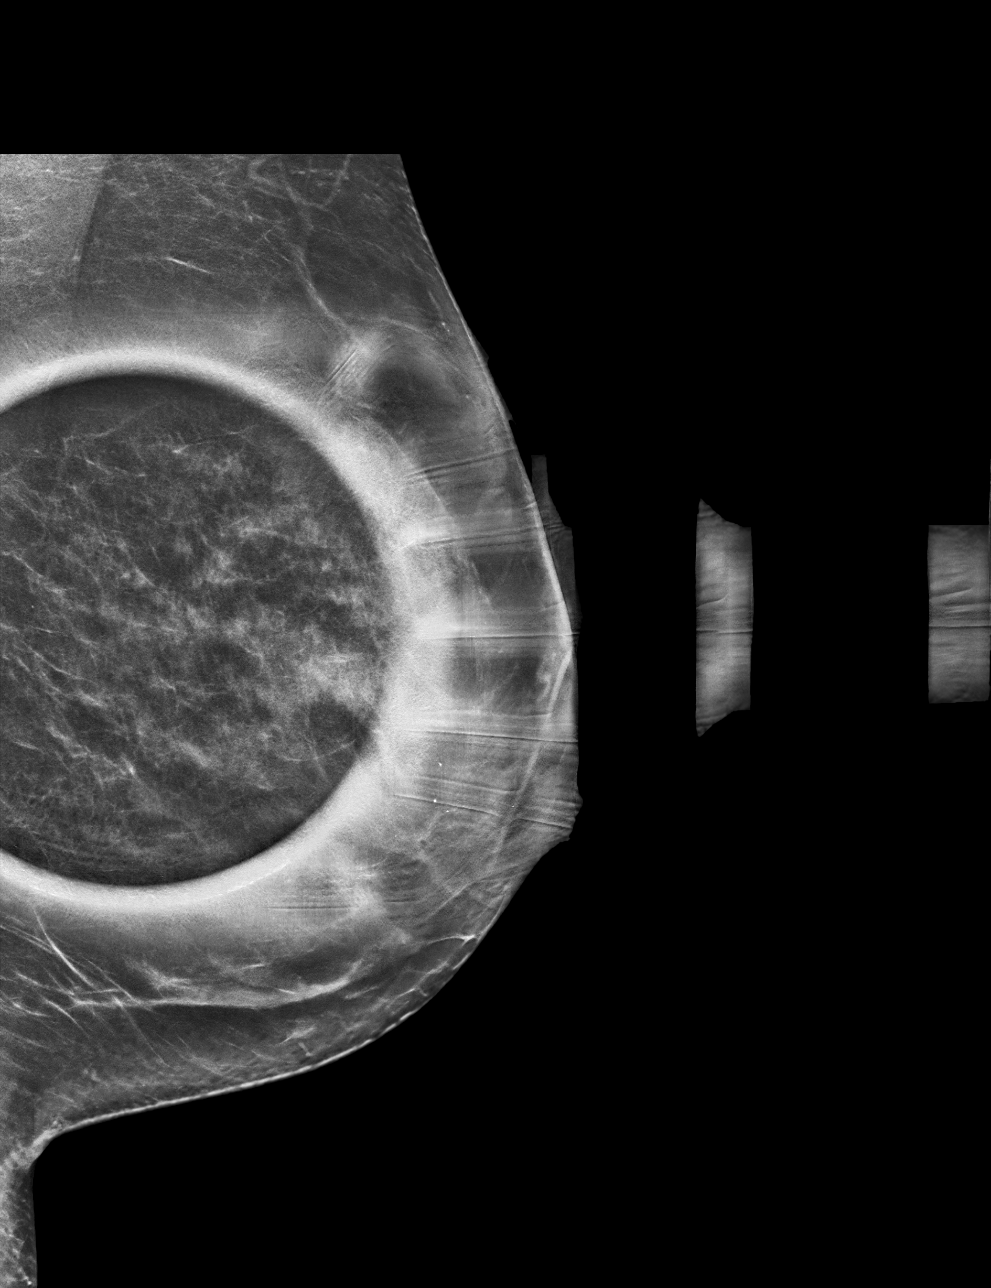

[L ML synth-2D]
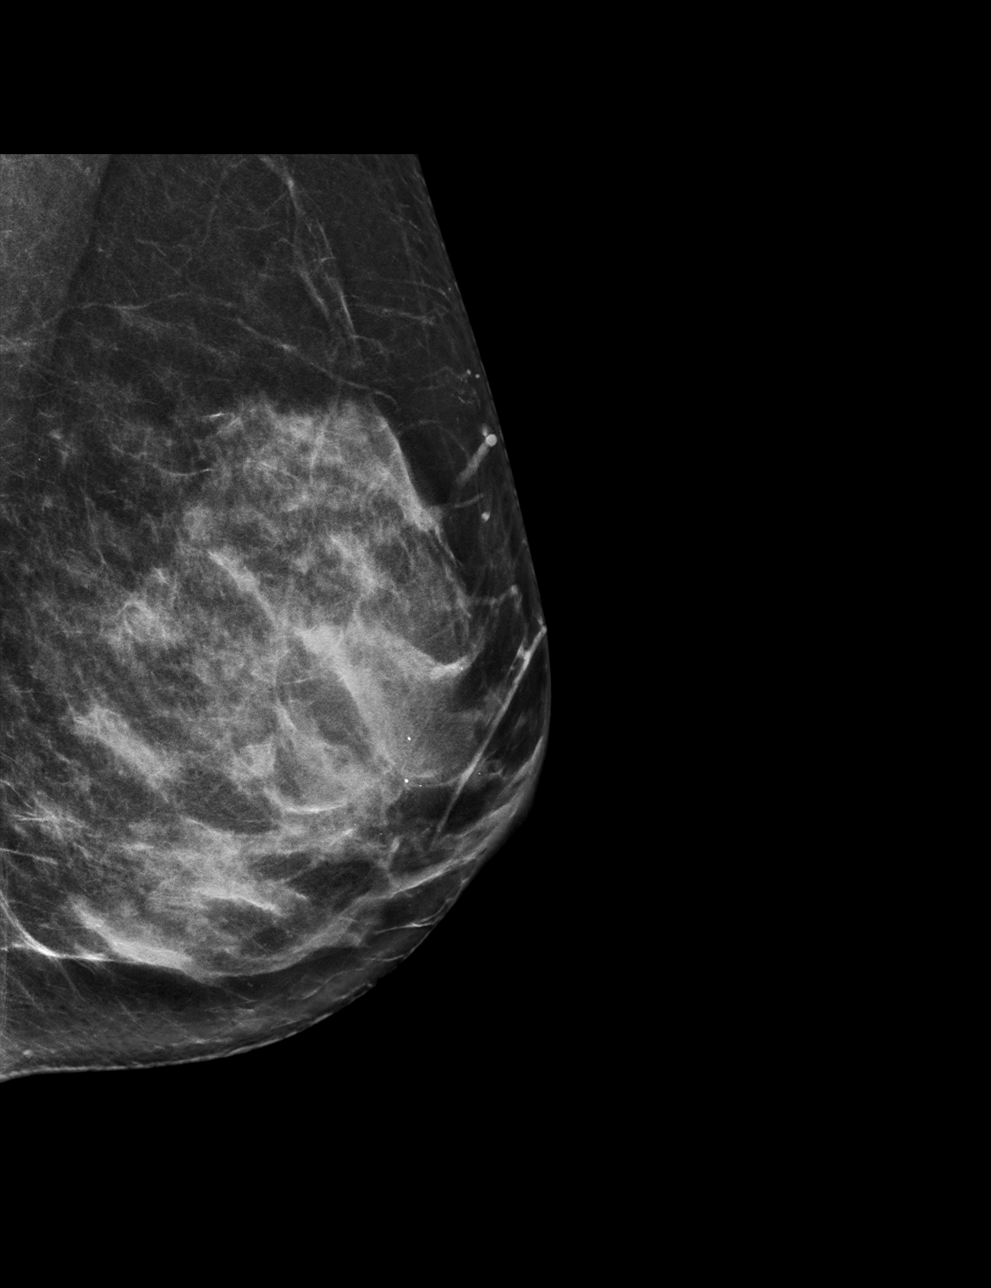

[L MLO tomo · tomo slice 33/66.0]
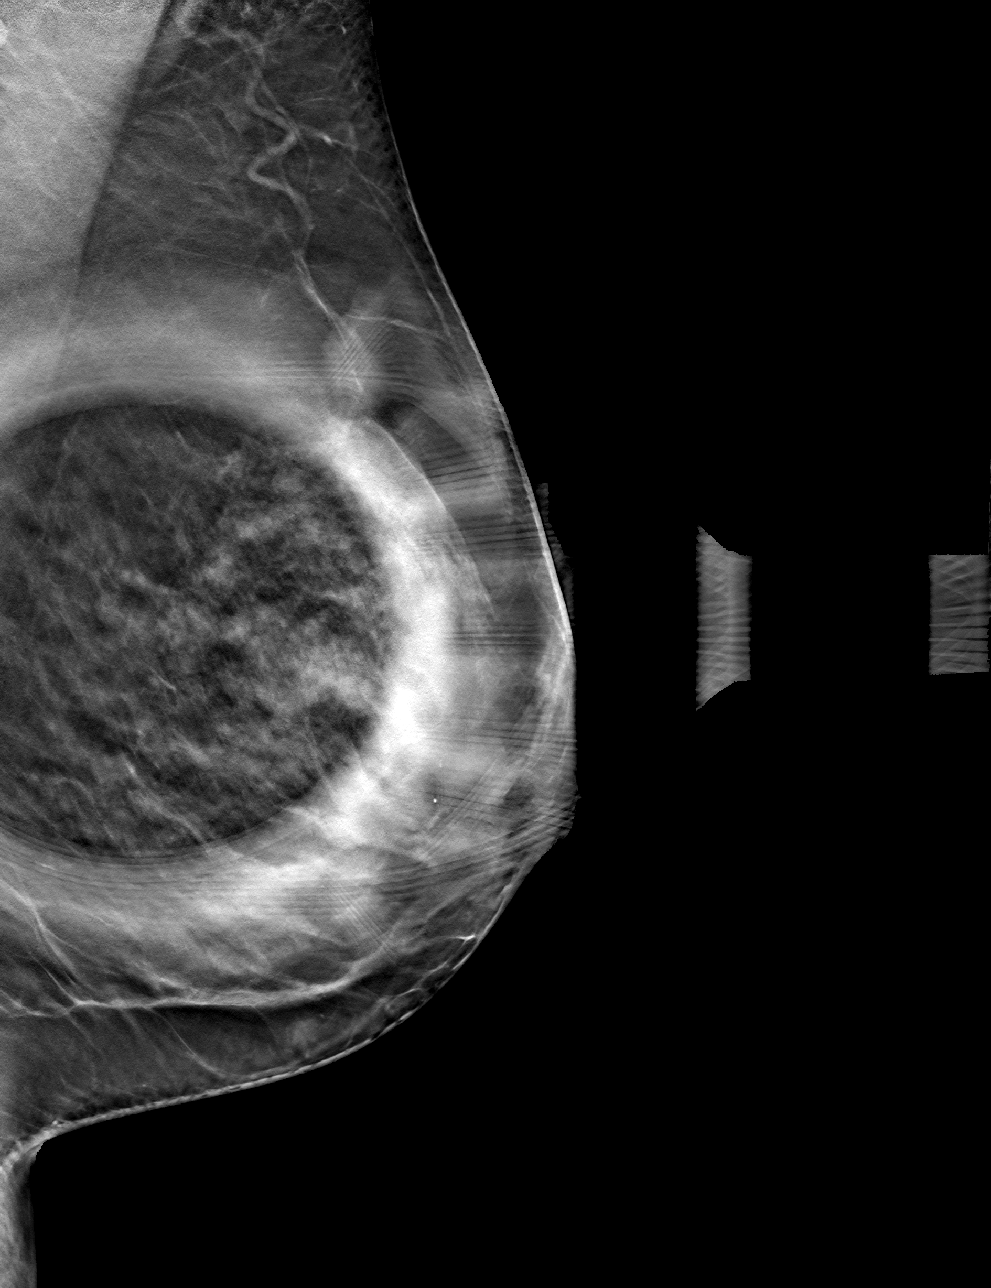

[L ML tomo · tomo slice 34/67.0]
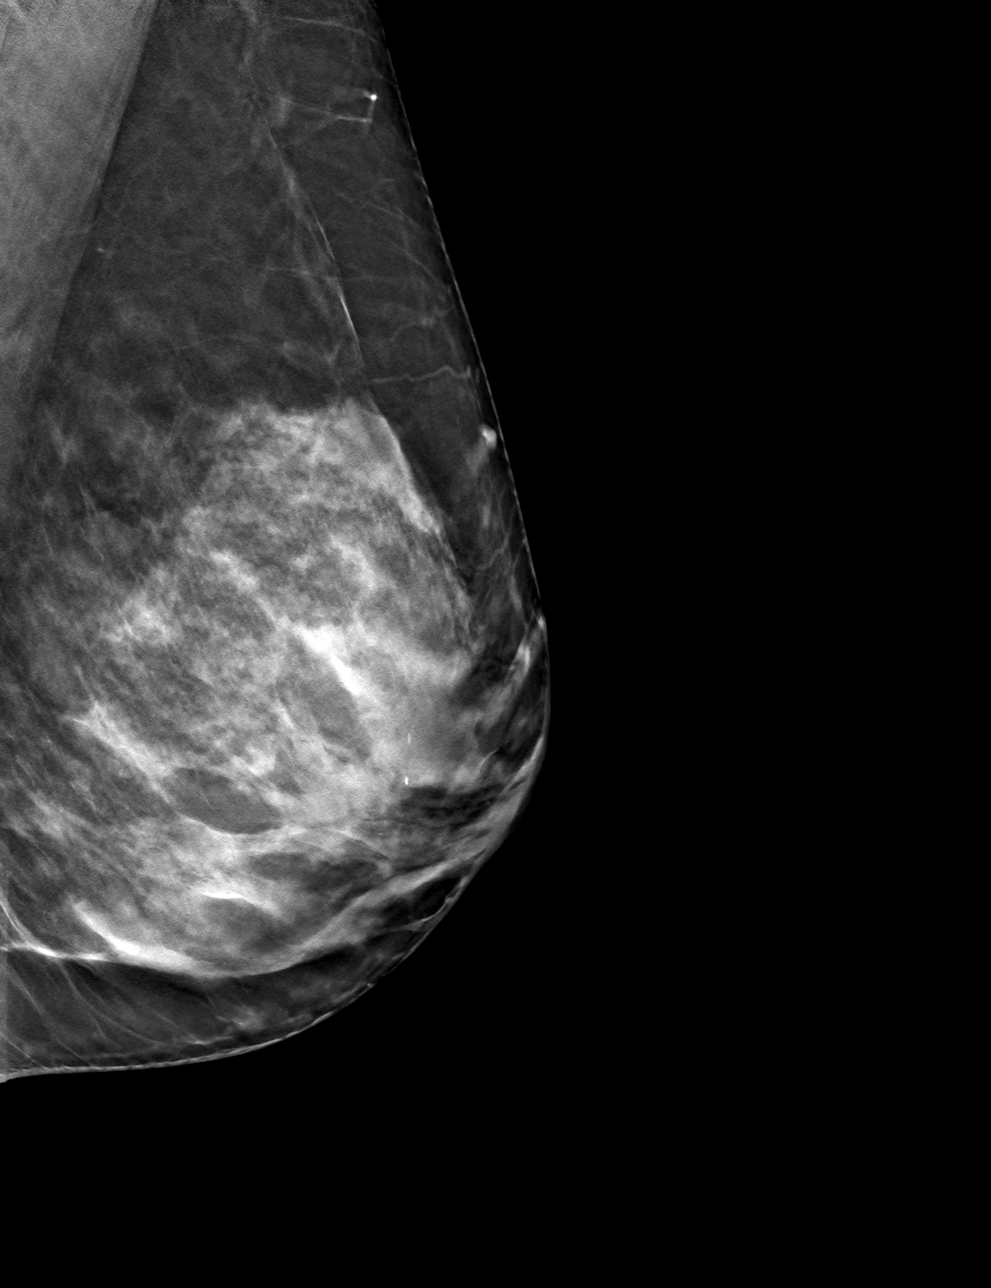

[4 of 12 positions shown; findings below may reference images not displayed]

ACR Breast Density Category c: The breast tissue is heterogeneously
dense, which may obscure small masses.
FINDINGS: Additional 2-D and 3-D images are performed. These views show dense
fibroglandular tissue without discrete mass in the UPPER posterior
aspect of the LEFT breast.

Targeted ultrasound is performed, showing dense fibroglandular
tissue in the UPPER-OUTER QUADRANT of the LEFT breast. Benign cysts
are identified in the 1 o'clock location 3 centimeters from the
nipple, measuring 0.7 is 0.6 centimeters. No solid masses or areas
of acoustic shadowing are identified in this portion of the breast.
IMPRESSION: No mammographic or ultrasound evidence for malignancy.

RECOMMENDATION:
Screening mammogram in one year.(Code:YE-7-125)

I have discussed the findings and recommendations with the patient.
If applicable, a reminder letter will be sent to the patient
regarding the next appointment.

BI-RADS CATEGORY  2: Benign.

## 2024-03-09 ENCOUNTER — Other Ambulatory Visit: Payer: Self-pay | Admitting: Obstetrics and Gynecology

## 2024-03-09 DIAGNOSIS — N63 Unspecified lump in unspecified breast: Secondary | ICD-10-CM

## 2024-03-09 DIAGNOSIS — N6324 Unspecified lump in the left breast, lower inner quadrant: Secondary | ICD-10-CM

## 2024-03-21 ENCOUNTER — Encounter: Payer: Self-pay | Admitting: Cardiology

## 2024-03-21 ENCOUNTER — Other Ambulatory Visit: Payer: Self-pay | Admitting: Obstetrics and Gynecology

## 2024-03-21 ENCOUNTER — Inpatient Hospital Stay
Admission: RE | Admit: 2024-03-21 | Discharge: 2024-03-21 | Attending: Obstetrics and Gynecology | Admitting: Obstetrics and Gynecology

## 2024-03-21 ENCOUNTER — Inpatient Hospital Stay
Admission: RE | Admit: 2024-03-21 | Discharge: 2024-03-21 | Payer: Self-pay | Attending: Obstetrics and Gynecology | Admitting: Obstetrics and Gynecology

## 2024-03-21 DIAGNOSIS — N63 Unspecified lump in unspecified breast: Secondary | ICD-10-CM

## 2024-03-25 ENCOUNTER — Inpatient Hospital Stay
Admission: RE | Admit: 2024-03-25 | Discharge: 2024-03-25 | Attending: Obstetrics and Gynecology | Admitting: Obstetrics and Gynecology

## 2024-03-25 ENCOUNTER — Ambulatory Visit
Admission: RE | Admit: 2024-03-25 | Discharge: 2024-03-25 | Disposition: A | Source: Ambulatory Visit | Attending: Obstetrics and Gynecology | Admitting: Obstetrics and Gynecology

## 2024-03-25 DIAGNOSIS — N63 Unspecified lump in unspecified breast: Secondary | ICD-10-CM

## 2024-03-25 HISTORY — PX: BREAST BIOPSY: SHX20

## 2024-03-28 ENCOUNTER — Other Ambulatory Visit: Payer: Self-pay | Admitting: Obstetrics and Gynecology

## 2024-03-28 DIAGNOSIS — N6489 Other specified disorders of breast: Secondary | ICD-10-CM

## 2024-03-28 LAB — SURGICAL PATHOLOGY

## 2024-03-29 ENCOUNTER — Other Ambulatory Visit

## 2024-03-29 ENCOUNTER — Telehealth: Payer: Self-pay | Admitting: *Deleted

## 2024-03-29 NOTE — Telephone Encounter (Signed)
 Spoke to patient to confirm upcoming morning [facility] clinic appointment on 1/7, paperwork will be sent via email  Gave location and time, also informed patient that the surgeon's office would be calling as well to get information from them similar to the packet that they will be receiving so make sure to do both.  Reminded patient that all providers will be coming to the clinic to see them HERE and if they had any questions to not hesitate to reach back out to myself or their navigators.

## 2024-04-04 ENCOUNTER — Ambulatory Visit
Admission: RE | Admit: 2024-04-04 | Discharge: 2024-04-04 | Disposition: A | Source: Ambulatory Visit | Attending: Obstetrics and Gynecology | Admitting: Obstetrics and Gynecology

## 2024-04-04 ENCOUNTER — Other Ambulatory Visit: Payer: Self-pay | Admitting: Obstetrics and Gynecology

## 2024-04-04 ENCOUNTER — Ambulatory Visit
Admission: RE | Admit: 2024-04-04 | Discharge: 2024-04-04 | Disposition: A | Discharge status: Home / Self Care | Source: Ambulatory Visit | Attending: Obstetrics and Gynecology | Admitting: Obstetrics and Gynecology

## 2024-04-04 DIAGNOSIS — N6489 Other specified disorders of breast: Secondary | ICD-10-CM

## 2024-04-04 HISTORY — PX: BREAST BIOPSY: SHX20

## 2024-04-06 LAB — SURGICAL PATHOLOGY

## 2024-04-11 DIAGNOSIS — Z170 Estrogen receptor positive status [ER+]: Secondary | ICD-10-CM | POA: Insufficient documentation

## 2024-04-12 ENCOUNTER — Encounter: Payer: Self-pay | Admitting: Obstetrics and Gynecology

## 2024-04-12 DIAGNOSIS — R928 Other abnormal and inconclusive findings on diagnostic imaging of breast: Secondary | ICD-10-CM

## 2024-04-12 NOTE — Progress Notes (Signed)
 " Radiation Oncology         [phone] ________________________________  Multidisciplinary Breast Oncology Clinic [facility]) Initial Outpatient Consultation  Name: Kelly Huffman MRN: [id number]  Date: 04/13/2024  DOB: 30-Dec-1975  RR:Fpoozm, Olam, MD  Aron Shoulders, MD   REFERRING PHYSICIAN: Aron Shoulders, MD  DIAGNOSIS: There were no encounter diagnoses.  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  HISTORY OF PRESENT ILLNESS::Kelly Huffman is a 49 y.o. female who is presenting to the office today for evaluation of her newly diagnosed left breast cancer. She is accompanied by ***. She is doing well overall.   She presented to medical attention earlier last month for evaluation of a palpable abnormality in the 10 o'clock retroareolar region of the left breast. She accordingly underwent bilateral diagnostic mammography with tomography and left breast ultrasonography at [facility] on 03/21/24 which revealed two indeterminate masses in the left breast consisting of 7 mm mass in the 10 o'clock left breast located 2 cmfn (representing the palpable abnormality), and a 3 mm mass in the retroareolar region of the left breast. The two masses are located approximately 1.1 cm apart and appear to be interconnected by a band of hypoechoic tissue that may represent ductal extension. Imaging otherwise showed no evidence of left axillary lymphadenopathy. Of note: physical exam conducted at the time of this study confirmed a palpable mass in the 10 o'clock left breast located 2 cmfn.    Biopsies of the left breast were collected on 03/25/24 (results detailed as follows):    -- Biopsy of the 10 o'clock retroareolar left breast showed a  fibrocystic changes and a microscopic fragment suggestive of intraductal papilloma.   -- Biopsy of the 10 o'clock left breast mass located 2 cmfn (the palpable mass) showed grade 1 invasive ductal carcinoma measuring 0.6 cm in the greatest linear extent of the sample. Prognostic indicators significant for: estrogen receptor 90% positive and progesterone  receptor 100% positive, both with strong staining intensity; Proliferation marker Ki67 at 1%; Her2 status negative; Grade 1.   A separate site of architectural distortion in the upper inner left breast was found on her post biopsy diagnotic mammogram. An ultrasound of the left breast was accordingly performed on 03/25/24 to further evaluate this finding which showed no sonographic correlate to account for the separate area of distortion in the upper inner left breast.    A biopsy of the upper inner left breast distortion was collected on 04/04/24. Pathology showed grade 2 invasive ductal carcinoma measuring 0.7 cm in the greatest linear extent of the sample with intermediate grade DCIS. Prognostic indicators significant for: estrogen receptor 100% positive and progesterone  receptor 100% positive, both with strong staining intensity; Proliferation marker Ki67 at 7%; Her2 status equivocal by IHC (FISH pending); Grade 2.   Menarche: *** years old Age at first live birth: *** years old GP: *** LMP: *** Contraceptive: *** HRT: ***   The patient was referred today for presentation in the multidisciplinary conference.  Radiology studies and pathology slides were presented there for review and discussion of treatment options.  A consensus was discussed regarding potential next steps.  PREVIOUS RADIATION THERAPY: No  PAST MEDICAL HISTORY:  Past Medical History:  Diagnosis  Date   GERD (gastroesophageal reflux disease)    Plantar fasciitis 09/21/2016    PAST SURGICAL HISTORY: Past Surgical History:  Procedure Laterality Date   BREAST BIOPSY Left  03/25/2024   US  LT BREAST BX W LOC DEV 1ST LESION IMG BX SPEC US  GUIDE 03/25/2024 [facility]   BREAST BIOPSY Left 03/25/2024   US  LT BREAST BX W LOC DEV EA ADD LESION IMG BX SPEC US  GUIDE 03/25/2024 [facility]   BREAST BIOPSY Left 04/04/2024   MM LT BREAST BX W LOC DEV 1ST LESION IMAGE BX SPEC STEREO GUIDE 04/04/2024 [facility]   CESAREAN SECTION     TUBAL LIGATION  2006    FAMILY HISTORY:  Family History  Problem Relation Age of Onset   Diabetes Mother    Bipolar disorder Mother    Heart attack Father    Heart attack Paternal Grandfather    Heart disease Paternal Uncle     SOCIAL HISTORY:  Social History   Socioeconomic History   Marital status: Married    Spouse name: Not on file   Number of children: Not on file   Years of education: Not on file   Highest education level: Not on file  Occupational History   Not on file  Tobacco Use   Smoking status: Never    Passive exposure: Never   Smokeless tobacco: Never  Vaping Use   Vaping status: Never Used  Substance and Sexual Activity   Alcohol use: No   Drug use: No   Sexual activity: Yes  Other Topics Concern   Not on file  Social History Narrative   Not on file   Social Drivers of Health   Tobacco Use: Low Risk (03/21/2024)   Patient History    Smoking Tobacco Use: Never    Smokeless Tobacco Use: Never    Passive Exposure: Never  Financial Resource Strain: Not on file  Food Insecurity: No Food Insecurity (04/12/2024)   Epic    Worried About Programme Researcher, Broadcasting/film/video in the Last Year: Never true    [employer] of Food in the Last Year: Never true  Transportation Needs: No Transportation Needs (04/12/2024)   Epic    Lack of Transportation (Medical): No    Lack of Transportation (Non-Medical): No  Physical Activity: Not on file  Stress: Not on file  Social Connections: Not on file  Depression (EYV7-0): Not on file  Alcohol Screen: Not on file  Housing: Low Risk (04/12/2024)   Epic    Unable to  Pay for Housing in the Last Year: No    Number of Times Moved in the Last Year: 0    Homeless in the Last Year: No  Utilities: Not At Risk (04/12/2024)   Epic    Threatened with loss of utilities: No  Health Literacy: Not on file    ALLERGIES: Allergies[1]  MEDICATIONS:  Current Outpatient Medications  Medication Sig Dispense Refill   escitalopram (LEXAPRO) 10 MG tablet Take 10 mg by mouth daily.     No current facility-administered medications for this encounter.    REVIEW OF SYSTEMS: A 10+ POINT REVIEW OF SYSTEMS WAS OBTAINED including neurology, dermatology, psychiatry, cardiac, respiratory, lymph, extremities, GI, GU, musculoskeletal, constitutional, reproductive, HEENT. On the provided form, she reports ***. She denies *** and any other symptoms.    PHYSICAL EXAM:  vitals were not taken for this visit.  {may need to copy over vitals} Lungs are clear to auscultation bilaterally. Heart has regular rate and  rhythm. No palpable cervical, supraclavicular, or axillary adenopathy. Abdomen soft, non-tender, normal bowel sounds. Breast: Right breast with no palpable mass, nipple discharge, or bleeding. Left breast with ***.   KPS = ***  100 - Normal; no complaints; no evidence of disease. 90   - Able to carry on normal activity; minor signs or symptoms of disease. 80   - Normal activity with effort; some signs or symptoms of disease. 59   - Cares for self; unable to carry on normal activity or to do active work. 60   - Requires occasional assistance, but is able to care for most of his personal needs. 50   - Requires considerable assistance and frequent medical care. 40   - Disabled; requires special care and assistance. 30   - Severely disabled; hospital admission is indicated although death not imminent. 20   - Very sick; hospital admission necessary; active supportive treatment necessary. 10   - Moribund; fatal processes progressing rapidly. 0     - Dead  Karnofsky DA, Abelmann  WH, Craver LS and Burchenal [facility] [phone]) The use of the nitrogen mustards in the palliative treatment of carcinoma: with particular reference to bronchogenic carcinoma Cancer 1 [id number]  LABORATORY DATA:  Lab Results  Component Value Date   WBC 6.1 05/13/2018   HGB 12.9 05/13/2018   HCT 42.1 05/13/2018   MCV 88.1 05/13/2018   PLT 230 05/13/2018   Lab Results  Component Value Date   NA 136 05/13/2018   K 3.3 (L) 05/13/2018   CL 106 05/13/2018   CO2 25 05/13/2018   Lab Results  Component Value Date   ALT 14 05/13/2018   AST 22 05/13/2018   ALKPHOS 48 05/13/2018   BILITOT 0.4 05/13/2018    PULMONARY FUNCTION TEST:   Review Flowsheet        No data to display          RADIOGRAPHY: MM LT BREAST BX W LOC DEV 1ST LESION IMAGE BX SPEC STEREO GUIDE Addendum Date: 04/06/2024 ADDENDUM REPORT: 04/06/2024 15:01 ADDENDUM: PATHOLOGY revealed: Breast, left, needle core biopsy, Upper inner quadrant (ribbon clip)- INVASIVE DUCTAL CARCINOMA, DUCTAL CARCINOMA IN SITU, INTERMEDIATE GRADE - OVERALL GRADE: 2 - LYMPHOVASCULAR INVASION: NOT IDENTIFIED - CANCER LENGTH: 7 MM - CALCIFICATIONS: NOT IDENTIFIED Pathology results are CONCORDANT with imaging findings, per Dr. Aliene Mir. Pathology results and recommendations below were discussed with patient by telephone. Patient reported biopsy site doing well with no adverse symptoms, and only slight tenderness at the site. Post biopsy care instructions were reviewed, questions were answered and my direct phone number was provided. Patient was instructed to call [facility] for any additional questions or concerns related to biopsy site. RECOMMENDATION: Patient to follow treatment plan / surgical management for recently diagnosed breast cancer. The patient has an appointment with the Breast Multidisciplinary Clinic at [facility] on 04/13/2024. 2. Consider pretreatment bilateral breast MRI to define extent of breast disease.  Pathology results reported by Mliss CHARM Molt RN 04/06/2024. Electronically Signed   By: Aliene Lloyd M.D.   On: 04/06/2024 15:01   Result Date: 04/06/2024 CLINICAL DATA:  49 year old woman with biopsy-proven IDC of the LEFT breast presents for stereotactic guided core needle biopsy of a separate site of architectural distortion. EXAM: LEFT BREAST STEREOTACTIC CORE NEEDLE BIOPSY COMPARISON:  Previous exam(s). FINDINGS: The patient and I discussed the procedure of stereotactic-guided biopsy including benefits and alternatives. We discussed the high likelihood of a successful procedure. We  discussed the risks of the procedure including infection, bleeding, tissue injury, clip migration, and inadequate sampling. Informed written consent was given. The usual time out protocol was performed immediately prior to the procedure. Using sterile technique and 1% Lidocaine as local anesthetic, under stereotactic guidance, a 9 gauge vacuum assisted device was used to perform core needle biopsy of the architectural distortion in the upper inner LEFT breast using a superior approach. Lesion quadrant: Upper inner quadrant At the conclusion of the procedure, ribbon shaped tissue marker clip was deployed into the biopsy cavity. Follow-up 2-view mammogram was performed and dictated separately. IMPRESSION: Stereotactic-guided biopsy of LEFT breast architectural distortion. No apparent complications. Electronically Signed: By: Aliene Lloyd M.D. On: 04/04/2024 14:28   US  LIMITED ULTRASOUND INCLUDING AXILLA LEFT BREAST  Result Date: 04/04/2024 CLINICAL DATA:  49 year old woman with biopsy-proven IDC of the LEFT breast presents for further evaluation of separate site of architectural distortion seen in the upper inner LEFT breast. EXAM: ULTRASOUND OF THE LEFT BREAST COMPARISON:  Multiple previous examinations FINDINGS: Targeted sonographic evaluation of the upper inner LEFT breast was performed, however no abnormality corresponding  to the architectural distortion was identified. IMPRESSION: Architectural distortion of the upper inner LEFT breast does not have a sonographic correlate. RECOMMENDATION: Stereotactic guided biopsy of LEFT breast architectural distortion. I have discussed the findings and recommendations with the patient. If applicable, a reminder letter will be sent to the patient regarding the next appointment. BI-RADS CATEGORY  4: Suspicious. Electronically Signed   By: Aliene Lloyd M.D.   On: 04/04/2024 14:31   MM CLIP PLACEMENT LEFT Result Date: 04/04/2024 CLINICAL DATA:  Status post stereotactic guided biopsy of LEFT breast architectural distortion. EXAM: 3D DIAGNOSTIC LEFT MAMMOGRAM POST STEREOTACTIC BIOPSY COMPARISON:  Previous exam(s). ACR Breast Density Category c: The breasts are heterogeneously dense, which may obscure small masses. FINDINGS: 3D Mammographic images were obtained following stereotactic guided biopsy of LEFT breast architectural distortion. The biopsy marking clip is in expected position at the site of biopsy. IMPRESSION: Appropriate positioning of the ribbon shaped biopsy marking clip at the site of biopsy in the upper-inner LEFT breast. Final Assessment: Post Procedure Mammograms for Marker Placement Electronically Signed   By: Aliene Lloyd M.D.   On: 04/04/2024 14:29   MM CLIP PLACEMENT LEFT Addendum Date: 03/28/2024 ADDENDUM REPORT: 03/28/2024 14:46 ADDENDUM: On further review, architectural distortion is noted in the middle depth of the upper inner LEFT breast. Recommendation: Diagnostic evaluation of LEFT breast architectural distortion. Electronically Signed   By: Aliene Lloyd M.D.   On: 03/28/2024 14:46   Result Date: 03/28/2024 CLINICAL DATA:  Status post ultrasound-guided biopsy of 2 LEFT breast masses. EXAM: 3D DIAGNOSTIC LEFT MAMMOGRAM POST ULTRASOUND BIOPSY COMPARISON:  Previous exam(s). ACR Breast Density Category c: The breasts are heterogeneously dense, which may obscure small  masses. FINDINGS: 3D Mammographic images were obtained following ultrasound guided biopsy of 2 LEFT breast masses. The biopsy marking clips are in expected position at the sites of biopsy. IMPRESSION: Appropriate positioning of the coil and venous shaped biopsy marking clips at the sites of biopsy in the upper inner LEFT breast. Final Assessment: Post Procedure Mammograms for Marker Placement Electronically Signed: By: Aliene Lloyd M.D. On: 03/25/2024 17:26   US  LT BREAST BX W LOC DEV 1ST LESION IMG BX SPEC US  GUIDE Addendum Date: 03/28/2024 ADDENDUM REPORT: 03/28/2024 14:43 ADDENDUM: PATHOLOGY revealed: Site 1. Breast, left, needle core biopsy, 10:00 retroareolar (coil clip)- MICROSCOPIC FRAGMENT SUGGESTIVE OF INTRADUCTAL PAPILLOMA. FIBROCYSTIC CHANGES. Pathology results  are CONCORDANT with imaging findings, per Dr. Aliene Mir. PATHOLOGY revealed: Site 2. Breast, left, needle core biopsy, 10:00 2 cmfn (venus clip)- INVASIVE DUCTAL CARCINOMA- OVERALL GRADE: 1- LYMPHOVASCULAR INVASION: NOT IDENTIFIED- CANCER LENGTH: 0.6 CM- CALCIFICATIONS: NOT IDENTIFIED. Pathology results are CONCORDANT with imaging findings, per Dr. Aliene Mir. Pathology results and recommendations below were discussed with patient by telephone. Patient reported biopsy site within normal limits with slight tenderness at the site. Post biopsy care instructions were reviewed, questions were answered and my direct phone number was provided to patient. Patient was instructed to call [facility] if any concerns or questions arise related to the biopsy. RECOMMENDATION: 1. Surgical and oncological consultation. The patient was referred to [facility] with appointment on 04/13/2024. 2. Patient to return for left breast diagnostic mammogram (and ultrasound if deemed necessary) to follow up area of distortion noted on post clip film. Patient will be  scheduled for this at her earliest convenience by the schedulers. Pathology results reported by Mliss CHARM Molt RN 03/28/2024. Electronically Signed   By: Aliene Lloyd M.D.   On: 03/28/2024 14:43   Result Date: 03/28/2024 CLINICAL DATA:  49 year old woman with 2 indeterminate LEFT breast masses presents for ultrasound-guided needle biopsy. EXAM: ULTRASOUND GUIDED LEFT BREAST CORE NEEDLE BIOPSY (x2) COMPARISON:  Previous exam(s). PROCEDURE: I met with the patient and we discussed the procedure of ultrasound-guided biopsy, including benefits and alternatives. We discussed the high likelihood of a successful procedure. We discussed the risks of the procedure, including infection, bleeding, tissue injury, clip migration, and inadequate sampling. Informed written consent was given. The usual time-out protocol was performed immediately prior to the procedure. Site 1: 3 mm LEFT breast mass (10 o'clock retroareolar) Lesion quadrant: Upper inner quadrant Using sterile technique and 1% Lidocaine as local anesthetic, under direct ultrasound visualization, a 14 gauge spring-loaded device was used to perform biopsy of 3 mm LEFT breast mass using a superior approach. At the conclusion of the procedure coil shaped tissue marker clip was deployed into the biopsy cavity. Site 1: 7 mm LEFT breast mass (10 o'clock 2 CMFN) Lesion quadrant: Upper inner quadrant Using sterile technique and 1% Lidocaine as local anesthetic, under direct ultrasound visualization, a 14 gauge spring-loaded device was used to perform biopsy of 7 mm LEFT breast mass using a superior approach. At the conclusion of the procedure venus shaped tissue marker clip was deployed into the biopsy cavity. Follow up 2 view mammogram was performed and dictated separately. IMPRESSION: Ultrasound guided biopsy of 3 and 7 mm LEFT breast masses. No apparent complications. Electronically Signed: By: Aliene Lloyd M.D. On: 03/25/2024 17:17   US  LT BREAST BX W LOC DEV EA ADD  LESION IMG BX SPEC US  GUIDE Addendum Date: 03/28/2024 ADDENDUM REPORT: 03/28/2024 14:43 ADDENDUM: PATHOLOGY revealed: Site 1. Breast, left, needle core biopsy, 10:00 retroareolar (coil clip)- MICROSCOPIC FRAGMENT SUGGESTIVE OF INTRADUCTAL PAPILLOMA. FIBROCYSTIC CHANGES. Pathology results are CONCORDANT with imaging findings, per Dr. Aliene Mir. PATHOLOGY revealed: Site 2. Breast, left, needle core biopsy, 10:00 2 cmfn (venus clip)- INVASIVE DUCTAL CARCINOMA- OVERALL GRADE: 1- LYMPHOVASCULAR INVASION: NOT IDENTIFIED- CANCER LENGTH: 0.6 CM- CALCIFICATIONS: NOT IDENTIFIED. Pathology results are CONCORDANT with imaging findings, per Dr. Aliene Mir. Pathology results and recommendations below were discussed with patient by telephone. Patient reported biopsy site within normal limits with slight tenderness at the site. Post biopsy care instructions were reviewed, questions were answered and my direct phone number  was provided to patient. Patient was instructed to call [facility] if any concerns or questions arise related to the biopsy. RECOMMENDATION: 1. Surgical and oncological consultation. The patient was [facility] to [facility] with appointment on 04/13/2024. 2. Patient to return for left breast diagnostic mammogram (and ultrasound if deemed necessary) to follow up area of distortion noted on post clip film. Patient will be scheduled for this at her earliest convenience by the schedulers. Pathology results reported by Mliss CHARM Molt RN 03/28/2024. Electronically Signed   By: Aliene Lloyd M.D.   On: 03/28/2024 14:43   Result Date: 03/28/2024 CLINICAL DATA:  49 year old woman with 2 indeterminate LEFT breast masses presents for ultrasound-guided needle biopsy. EXAM: ULTRASOUND GUIDED LEFT BREAST CORE NEEDLE BIOPSY (x2) COMPARISON:  Previous exam(s). PROCEDURE: I met with the patient and we discussed the procedure of  ultrasound-guided biopsy, including benefits and alternatives. We discussed the high likelihood of a successful procedure. We discussed the risks of the procedure, including infection, bleeding, tissue injury, clip migration, and inadequate sampling. Informed written consent was given. The usual time-out protocol was performed immediately prior to the procedure. Site 1: 3 mm LEFT breast mass (10 o'clock retroareolar) Lesion quadrant: Upper inner quadrant Using sterile technique and 1% Lidocaine as local anesthetic, under direct ultrasound visualization, a 14 gauge spring-loaded device was used to perform biopsy of 3 mm LEFT breast mass using a superior approach. At the conclusion of the procedure coil shaped tissue marker clip was deployed into the biopsy cavity. Site 1: 7 mm LEFT breast mass (10 o'clock 2 CMFN) Lesion quadrant: Upper inner quadrant Using sterile technique and 1% Lidocaine as local anesthetic, under direct ultrasound visualization, a 14 gauge spring-loaded device was used to perform biopsy of 7 mm LEFT breast mass using a superior approach. At the conclusion of the procedure venus shaped tissue marker clip was deployed into the biopsy cavity. Follow up 2 view mammogram was performed and dictated separately. IMPRESSION: Ultrasound guided biopsy of 3 and 7 mm LEFT breast masses. No apparent complications. Electronically Signed: By: Aliene Lloyd M.D. On: 03/25/2024 17:17   MM 3D DIAGNOSTIC MAMMOGRAM BILATERAL BREAST Result Date: 03/21/2024 CLINICAL DATA:  49 year old female complaining of a palpable abnormality in the 10 o'clock retroareolar region of the left breast. EXAM: DIGITAL DIAGNOSTIC BILATERAL MAMMOGRAM WITH TOMOSYNTHESIS AND CAD; ULTRASOUND LEFT BREAST LIMITED TECHNIQUE: Bilateral digital diagnostic mammography and breast tomosynthesis was performed. The images were evaluated with computer-aided detection. ; Targeted ultrasound examination of the left breast was performed. COMPARISON:   Previous exam(s). ACR Breast Density Category c: The breasts are heterogeneously dense, which may obscure small masses. FINDINGS: Right breast: No suspicious mass or malignant type microcalcifications identified in the right breast. Left breast: Radiopaque BB marks the palpable abnormality in the 10 o'clock retroareolar region of the left breast. Underlying the radiopaque BB is a superficial mass with indistinct borders. There are no malignant type microcalcifications. On physical exam, I palpate a discrete mass in the left breast at 10 o'clock 2 cm from the nipple. Targeted ultrasound is performed, showing an irregular hypoechoic mass with irregular margins in the left breast at 10 o'clock 2 cm from the nipple measuring 7 x 5 x 5 mm. This mass accounts for the palpable abnormality. There is a band of hypoechoic tissue which may be an abnormal duct leading to a second mass in the retroareolar region of the breast measuring 3 x  2 x 3 mm. The masses are located proximally 1.1 cm apart. Sonographic evaluation of the left axilla does not show any enlarged adenopathy. IMPRESSION: Two indeterminate masses in the left breast. The first mass accounts for the palpable abnormality and is located 10 o'clock 2 cm from the nipple, measuring 7 mm. The second mass is in the retroareolar region of the breast measuring 3 mm. The 2 masses are interconnected by a band of hypoechoic tissue which may be ductal extension. RECOMMENDATION: Ultrasound-guided core biopsies of the masses in the 10 o'clock region of the left breast 2 cm from the nipple and in the retroareolar region of the breast is recommended. I have discussed the findings and recommendations with the patient. If applicable, a reminder letter will be sent to the patient regarding the next appointment. BI-RADS CATEGORY  4: Suspicious. Electronically Signed   By: Dina  Arceo M.D.   On: 03/21/2024 16:38   US  LIMITED ULTRASOUND INCLUDING AXILLA LEFT BREAST  Result Date:  03/21/2024 CLINICAL DATA:  49 year old female complaining of a palpable abnormality in the 10 o'clock retroareolar region of the left breast. EXAM: DIGITAL DIAGNOSTIC BILATERAL MAMMOGRAM WITH TOMOSYNTHESIS AND CAD; ULTRASOUND LEFT BREAST LIMITED TECHNIQUE: Bilateral digital diagnostic mammography and breast tomosynthesis was performed. The images were evaluated with computer-aided detection. ; Targeted ultrasound examination of the left breast was performed. COMPARISON:  Previous exam(s). ACR Breast Density Category c: The breasts are heterogeneously dense, which may obscure small masses. FINDINGS: Right breast: No suspicious mass or malignant type microcalcifications identified in the right breast. Left breast: Radiopaque BB marks the palpable abnormality in the 10 o'clock retroareolar region of the left breast. Underlying the radiopaque BB is a superficial mass with indistinct borders. There are no malignant type microcalcifications. On physical exam, I palpate a discrete mass in the left breast at 10 o'clock 2 cm from the nipple. Targeted ultrasound is performed, showing an irregular hypoechoic mass with irregular margins in the left breast at 10 o'clock 2 cm from the nipple measuring 7 x 5 x 5 mm. This mass accounts for the palpable abnormality. There is a band of hypoechoic tissue which may be an abnormal duct leading to a second mass in the retroareolar region of the breast measuring 3 x 2 x 3 mm. The masses are located proximally 1.1 cm apart. Sonographic evaluation of the left axilla does not show any enlarged adenopathy. IMPRESSION: Two indeterminate masses in the left breast. The first mass accounts for the palpable abnormality and is located 10 o'clock 2 cm from the nipple, measuring 7 mm. The second mass is in the retroareolar region of the breast measuring 3 mm. The 2 masses are interconnected by a band of hypoechoic tissue which may be ductal extension. RECOMMENDATION: Ultrasound-guided core biopsies  of the masses in the 10 o'clock region of the left breast 2 cm from the nipple and in the retroareolar region of the breast is recommended. I have discussed the findings and recommendations with the patient. If applicable, a reminder letter will be sent to the patient regarding the next appointment. BI-RADS CATEGORY  4: Suspicious. Electronically Signed   By: Dina  Arceo M.D.   On: 03/21/2024 16:38      IMPRESSION: ***   Patient will be a good candidate for breast conservation with radiotherapy to the left breast. We discussed the general course of radiation, potential side effects, and toxicities with radiation and the patient is interested in this approach. ***   PLAN:  ***   ------------------------------------------------  Lynwood CHARM Nasuti, PhD, MD  This document serves as a record of services personally performed by Lynwood Nasuti, MD. It was created on his behalf by Dorthy Fuse, a trained medical scribe. The creation of this record is based on the scribe's personal observations and the provider's statements to them. This document has been checked and approved by the attending provider.     [1]  Allergies Allergen Reactions   Cortisone Other (See Comments)    Severe anxiety and insomnia w/ steroids ( oral or injection )   "

## 2024-04-13 ENCOUNTER — Other Ambulatory Visit: Payer: Self-pay

## 2024-04-13 ENCOUNTER — Ambulatory Visit
Admission: RE | Admit: 2024-04-13 | Discharge: 2024-04-13 | Disposition: A | Discharge status: Home / Self Care | Source: Ambulatory Visit | Attending: Radiation Oncology | Admitting: Radiation Oncology

## 2024-04-13 ENCOUNTER — Inpatient Hospital Stay

## 2024-04-13 ENCOUNTER — Ambulatory Visit: Attending: General Surgery | Admitting: Physical Therapy

## 2024-04-13 ENCOUNTER — Encounter: Payer: Self-pay | Admitting: *Deleted

## 2024-04-13 ENCOUNTER — Encounter: Payer: Self-pay | Admitting: Physical Therapy

## 2024-04-13 ENCOUNTER — Inpatient Hospital Stay: Admitting: Licensed Clinical Social Worker

## 2024-04-13 ENCOUNTER — Inpatient Hospital Stay: Admitting: Hematology and Oncology

## 2024-04-13 ENCOUNTER — Other Ambulatory Visit: Payer: Self-pay | Admitting: *Deleted

## 2024-04-13 VITALS — BP 138/88 | HR 93 | Temp 98.8°F | Resp 17

## 2024-04-13 DIAGNOSIS — Z8052 Family history of malignant neoplasm of bladder: Secondary | ICD-10-CM

## 2024-04-13 DIAGNOSIS — Z17410 Hormone receptor positive with human epidermal growth factor receptor 2 positive status: Secondary | ICD-10-CM | POA: Insufficient documentation

## 2024-04-13 DIAGNOSIS — Z170 Estrogen receptor positive status [ER+]: Secondary | ICD-10-CM

## 2024-04-13 DIAGNOSIS — Z8042 Family history of malignant neoplasm of prostate: Secondary | ICD-10-CM

## 2024-04-13 DIAGNOSIS — Z1379 Encounter for other screening for genetic and chromosomal anomalies: Secondary | ICD-10-CM | POA: Diagnosis not present

## 2024-04-13 DIAGNOSIS — L814 Other melanin hyperpigmentation: Secondary | ICD-10-CM | POA: Insufficient documentation

## 2024-04-13 DIAGNOSIS — C50212 Malignant neoplasm of upper-inner quadrant of left female breast: Secondary | ICD-10-CM | POA: Insufficient documentation

## 2024-04-13 DIAGNOSIS — L821 Other seborrheic keratosis: Secondary | ICD-10-CM | POA: Insufficient documentation

## 2024-04-13 DIAGNOSIS — R293 Abnormal posture: Secondary | ICD-10-CM | POA: Insufficient documentation

## 2024-04-13 DIAGNOSIS — C50812 Malignant neoplasm of overlapping sites of left female breast: Secondary | ICD-10-CM | POA: Diagnosis not present

## 2024-04-13 DIAGNOSIS — D1801 Hemangioma of skin and subcutaneous tissue: Secondary | ICD-10-CM | POA: Insufficient documentation

## 2024-04-13 DIAGNOSIS — D2370 Other benign neoplasm of skin of unspecified lower limb, including hip: Secondary | ICD-10-CM | POA: Insufficient documentation

## 2024-04-13 LAB — CMP (CANCER CENTER ONLY)
ALT: 8 U/L (ref 0–44)
AST: 18 U/L (ref 15–41)
Albumin: 4.6 g/dL (ref 3.5–5.0)
Alkaline Phosphatase: 82 U/L (ref 38–126)
Anion gap: 12 (ref 5–15)
BUN: 18 mg/dL (ref 6–20)
CO2: 24 mmol/L (ref 22–32)
Calcium: 9.4 mg/dL (ref 8.9–10.3)
Chloride: 102 mmol/L (ref 98–111)
Creatinine: 0.89 mg/dL (ref 0.44–1.00)
GFR, Estimated: >60 mL/min
Glucose, Bld: 94 mg/dL (ref 70–99)
Potassium: 4 mmol/L (ref 3.5–5.1)
Sodium: 137 mmol/L (ref 135–145)
Total Bilirubin: 0.3 mg/dL (ref 0.0–1.2)
Total Protein: 8.1 g/dL (ref 6.5–8.1)

## 2024-04-13 LAB — CBC WITH DIFFERENTIAL (CANCER CENTER ONLY)
Abs Immature Granulocytes: 0.03 K/uL (ref 0.00–0.07)
Basophils Absolute: 0.1 K/uL (ref 0.0–0.1)
Basophils Relative: 1 %
Eosinophils Absolute: 0.1 K/uL (ref 0.0–0.5)
Eosinophils Relative: 1 %
HCT: 40.6 % (ref 36.0–46.0)
Hemoglobin: 13 g/dL (ref 12.0–15.0)
Immature Granulocytes: 0 %
Lymphocytes Relative: 17 %
Lymphs Abs: 1.6 K/uL (ref 0.7–4.0)
MCH: 26.2 pg (ref 26.0–34.0)
MCHC: 32 g/dL (ref 30.0–36.0)
MCV: 81.7 fL (ref 80.0–100.0)
Monocytes Absolute: 0.4 K/uL (ref 0.1–1.0)
Monocytes Relative: 4 %
Neutro Abs: 7.3 K/uL (ref 1.7–7.7)
Neutrophils Relative %: 77 %
Platelet Count: 469 K/uL — ABNORMAL HIGH (ref 150–400)
RBC: 4.97 MIL/uL (ref 3.87–5.11)
RDW: 15.4 % (ref 11.5–15.5)
WBC Count: 9.4 K/uL (ref 4.0–10.5)
nRBC: 0 % (ref 0.0–0.2)

## 2024-04-13 LAB — GENETIC SCREENING ORDER

## 2024-04-13 NOTE — Therapy (Signed)
 " OUTPATIENT PHYSICAL THERAPY BREAST CANCER BASELINE EVALUATION   Patient Name: Kelly Huffman MRN: [id number] DOB:1975/12/05, 49 y.o., female Today's Date: 04/13/2024  END OF SESSION:  PT End of Session - 04/13/24 1110     Visit Number 1    Number of Visits 2    Date for Recertification  06/08/24    PT Start Time 1000    PT Stop Time 1035    PT Time Calculation (min) 35 min    Activity Tolerance Patient tolerated treatment well    Behavior During Therapy [facility] for tasks assessed/performed          Past Medical History:  Diagnosis Date   Anxiety    GERD (gastroesophageal reflux disease)    Plantar fasciitis 09/21/2016   Past Surgical History:  Procedure Laterality Date   BREAST BIOPSY Left 03/25/2024   US  LT BREAST BX W LOC DEV 1ST LESION IMG BX SPEC US  GUIDE 03/25/2024 [facility]   BREAST BIOPSY Left 03/25/2024   US  LT BREAST BX W LOC DEV EA ADD LESION IMG BX SPEC US  GUIDE 03/25/2024 [facility]   BREAST BIOPSY Left 04/04/2024   MM LT BREAST BX W LOC DEV 1ST LESION IMAGE BX SPEC STEREO GUIDE 04/04/2024 [facility]   CESAREAN SECTION     TUBAL LIGATION  2006   Patient Active Problem List   Diagnosis Date Noted   Malignant neoplasm of overlapping sites of left breast in female, estrogen receptor positive (HCC) 04/11/2024   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 Jina Nephew  REFERRING DIAG: Left breast cancer  THERAPY DIAG:  Malignant neoplasm of upper-inner quadrant of left breast in female, estrogen receptor positive (HCC)  Abnormal posture  Rationale for Evaluation and Treatment: Rehabilitation  ONSET DATE: 03/21/2025  SUBJECTIVE:                                                                                                                                                                                           SUBJECTIVE  STATEMENT: Patient reports she is here today to be seen by her medical team for her newly diagnosed left breast cancer.   PERTINENT HISTORY:  Patient was diagnosed on 03/21/2025 with left grade 2 invasive ductal carcinoma breast cancer. It measures 7 mm and 7 mm (2 masses) and is located in the upper inner quadrant. One area is triple positive with a Ki67 of 7% and the other is ER/PR positive and HER2 negative with a Ki67 of 1%.   PATIENT GOALS:   reduce lymphedema risk and  learn post op HEP.   PAIN:  Are you having pain? No  PRECAUTIONS: Active CA   RED FLAGS: None   HAND DOMINANCE: right  WEIGHT BEARING RESTRICTIONS: No  FALLS:  Has patient fallen in last 6 months? No  LIVING ENVIRONMENT: Patient lives with: her husband, 25 y.o. daughter and 30 y.o. son Lives in: House/apartment Has following equipment at home: None  OCCUPATION: Production Designer, Theatre/television/film for [employer]; works from home  LEISURE: She goes to the gym 3x/week and alternates cardio one day and strength training the next day  PRIOR LEVEL OF FUNCTION: Independent   OBJECTIVE: Note: Objective measures were completed at Evaluation unless otherwise noted.  COGNITION: Overall cognitive status: Within functional limits for tasks assessed    POSTURE:  Forward head and rounded shoulders posture  UPPER EXTREMITY AROM/PROM:  A/PROM RIGHT   eval   Shoulder extension 41  Shoulder flexion 159  Shoulder abduction 175  Shoulder internal rotation 70  Shoulder external rotation 86    (Blank rows = not tested)  A/PROM LEFT   eval  Shoulder extension 40  Shoulder flexion 149  Shoulder abduction 168  Shoulder internal rotation 68  Shoulder external rotation 88    (Blank rows = not tested)  CERVICAL AROM: All within normal limits  UPPER EXTREMITY STRENGTH: WNL  LYMPHEDEMA ASSESSMENTS (in cm):   LANDMARK RIGHT   eval  10 cm proximal to olecranon process from proximal aspect of olecranon 31.8  Olecranon process 25.8   10 cm proximal to ulnar styloid process from proximal aspect of styloid process 24.3  Just distal to ulnar styloid process 15.6  Across hand at thumb web space 19  At base of 2nd digit 5.8  (Blank rows = not tested)  LANDMARK LEFT   eval  10 cm proximal to olecranon process from proximal aspect of olecranon 30.3   Olecranon process 25  10 cm proximal to ulnar styloid process from proximal aspect of styloid process 22.4  Just distal to ulnar styloid process 15.3  Across hand at thumb web space 18  At base of 2nd digit 5.5  (Blank rows = not tested)  L-DEX LYMPHEDEMA SCREENING:  The patient was assessed using the L-Dex machine today to produce a lymphedema index baseline score. The patient will be reassessed on a regular basis (typically every 3 months) to obtain new L-Dex scores. If the score is > 6.5 points away from his/her baseline score indicating onset of subclinical lymphedema, it will be recommended to wear a compression garment for 4 weeks, 12 hours per day and then be reassessed. If the score continues to be > 6.5 points from baseline at reassessment, we will initiate lymphedema treatment. Assessing in this manner has a 95% rate of preventing clinically significant lymphedema.   L-DEX FLOWSHEETS - 04/13/24 1100       L-DEX LYMPHEDEMA SCREENING   Measurement Type Unilateral    L-DEX MEASUREMENT EXTREMITY Upper Extremity    POSITION  Standing    DOMINANT SIDE Right    At Risk Side Left    BASELINE SCORE (UNILATERAL) -2.6          QUICK DASH SURVEY:  Kelly Huffman - 04/13/24 0001     Open a tight or new jar No difficulty    Do heavy household chores (wash walls, wash floors) No difficulty    Carry a shopping bag or briefcase No difficulty    Wash your back No difficulty    Use a knife to cut food  No difficulty    Recreational activities in which you take some force or impact through your arm, shoulder, or hand (golf, hammering, tennis) No difficulty    During the past  week, to what extent has your arm, shoulder or hand problem interfered with your normal social activities with family, friends, neighbors, or groups? Not at all    During the past week, to what extent has your arm, shoulder or hand problem limited your work or other regular daily activities Not at all    Arm, shoulder, or hand pain. None    Tingling (pins and needles) in your arm, shoulder, or hand Mild    Difficulty Sleeping No difficulty    DASH Score 2.27 %           PATIENT EDUCATION:  Education details: Time spent educating patient on aspects of self-care to maximize post op recovery. Patient was educated on where and how to get a post op compression bra to use to reduce post op edema. Patient was also educated on the use of SOZO screenings and surveillance principles for early identification of lymphedema onset. She was instructed to use the post op pillow in the axilla for pressure and pain relief. Patient educated on lymphedema risk reduction and post op shoulder/posture HEP. Person educated: Patient Education method: Explanation, Demonstration, Handout Education comprehension: Patient verbalized understanding and returned demonstration  HOME EXERCISE PROGRAM: Patient was instructed today in a home exercise program today for post op shoulder range of motion. These included active assist shoulder flexion in sitting, scapular retraction, wall walking with shoulder abduction, and hands behind head external rotation.  She was encouraged to do these twice a day, holding 3 seconds and repeating 5 times when permitted by her physician.   ASSESSMENT:  CLINICAL IMPRESSION: Patient was diagnosed on 03/21/2025 with left grade 2 invasive ductal carcinoma breast cancer. It measures 7 mm and 7 mm (2 masses) and is located in the upper inner quadrant. One area is triple positive with a Ki67 of 7% and the other is ER/PR positive and HER2 negative with a Ki67 of 1%.Her multidisciplinary medical team  met prior to her assessments to determine a recommended treatment plan. She is planning to have a left lumpectomy for both masses and a sentinel node biopsy followed by possible chemotherapy depending on the final triple positive mass size, radiation, and anti-estrogen therapy. She will benefit from a post op PT reassessment to determine needs and from L-Dex screens every 3 months for 2 years to detect subclinical lymphedema.  Pt will benefit from skilled therapeutic intervention to improve on the following deficits: Decreased knowledge of precautions, impaired UE functional use, pain, decreased ROM, postural dysfunction.   PT treatment/interventions: ADL/self-care home management, pt/family education, therapeutic exercise  REHAB POTENTIAL: Excellent  CLINICAL DECISION MAKING: Stable/uncomplicated  EVALUATION COMPLEXITY: Low   GOALS: Goals reviewed with patient? YES  LONG TERM GOALS: (STG=LTG)    Name Target Date Goal status  1 Pt will be able to verbalize understanding of pertinent lymphedema risk reduction practices relevant to her dx specifically related to skin care.  Baseline:  No knowledge 04/13/2024 Achieved at eval  2 Pt will be able to return demo and/or verbalize understanding of the post op HEP related to regaining shoulder ROM. Baseline:  No knowledge 04/13/2024 Achieved at eval  3 Pt will be able to verbalize understanding of the importance of viewing the post op After Breast CA Class video for further lymphedema risk reduction education and therapeutic exercise.  Baseline:  No knowledge 04/13/2024 Achieved at eval  4 Pt will demo she has regained full shoulder ROM and function post operatively compared to baselines.  Baseline: See objective measurements taken today. 06/08/2024     PLAN:  PT FREQUENCY/DURATION: EVAL and 1 follow up appointment.   PLAN FOR NEXT SESSION: will reassess 3-4 weeks post op to determine needs.   Patient will follow up at outpatient cancer rehab 3-4  weeks following surgery.  If the patient requires physical therapy at that time, a specific plan will be dictated and sent to the referring physician for approval. The patient was educated today on appropriate basic range of motion exercises to begin post operatively and the importance of viewing the After Breast Cancer class video following surgery.  Patient was educated today on lymphedema risk reduction practices as it pertains to recommendations that will benefit the patient immediately following surgery.  She verbalized good understanding.    Physical Therapy Information for After Breast Cancer Surgery/Treatment:  Lymphedema is a swelling condition that you may be at risk for in your arm if you have lymph nodes removed from the armpit area.  After a sentinel node biopsy, the risk is approximately 5-9% and is higher after an axillary node dissection.  There is treatment available for this condition and it is not life-threatening.  Contact your physician or physical therapist with concerns. You may begin the 4 shoulder/posture exercises (see additional sheet) when permitted by your physician (typically a week after surgery).  If you have drains, you may need to wait until those are removed before beginning range of motion exercises.  A general recommendation is to not lift your arms above shoulder height until drains are removed.  These exercises should be done to your tolerance and gently.  This is not a no pain/no gain type of recovery so listen to your body and stretch into the range of motion that you can tolerate, stopping if you have pain.  If you are having immediate reconstruction, ask your plastic surgeon about doing exercises as he or she may want you to wait. We encourage you to view the After Breast Cancer class video following surgery.  You will learn information related to lymphedema risk, prevention and treatment and additional exercises to regain mobility following surgery.   While  undergoing any medical procedure or treatment, try to avoid blood pressure being taken or needle sticks from occurring on the arm on the side of cancer.   This recommendation begins after surgery and continues for the rest of your life.  This may help reduce your risk of getting lymphedema (swelling in your arm). An excellent resource for those seeking information on lymphedema is the National Lymphedema Network's web site. It can be accessed at www.lymphnet.org If you notice swelling in your hand, arm or breast at any time following surgery (even if it is many years from now), please contact your doctor or physical therapist to discuss this.  Lymphedema can be treated at any time but it is easier for you if it is treated early on.  If you feel like your shoulder motion is not returning to normal in a reasonable amount of time, please contact your surgeon or physical therapist.  [facility] [phone]. [address], [place] [address]  [facility] CLASS After Breast Cancer Class  After Breast Cancer Class is a specially designed exercise class video to assist you in a safe recover after having breast cancer surgery.  In this video you will learn how to get back to full function whether your drains were just removed or if you had surgery a month ago. The video can be viewed on this page: [email or web address].boyd-meyer.org/ or on YouTube here: https://youtu.[email or web address].  Class Goals  Understand specific stretches to improve the flexibility of you chest and shoulder. Learn ways to safely strengthen your upper body and improve your posture. Understand the warning signs of infection and why you may be at risk for an arm infection. Learn about Lymphedema and prevention.  ** You do not need to view this video until after surgery.  Drains should be removed to participate in the recommended exercises on the  video.  Patient was instructed today in a home exercise program today for post op shoulder range of motion. These included active assist shoulder flexion in sitting, scapular retraction, wall walking with shoulder abduction, and hands behind head external rotation.  She was encouraged to do these twice a day, holding 3 seconds and repeating 5 times when permitted by her physician.  Eward Wonda Sharps, [place] 04/13/2024 11:19 AM  "

## 2024-04-13 NOTE — Progress Notes (Signed)
 [facility] Clinical Social Work  Initial Assessment   Kelly Huffman is a 49 y.o. year old female accompanied by husband, Gilmore. Clinical Social Work was referred by [facility] for assessment of psychosocial needs.   SDOH (Social Determinants of Health) assessments performed: Yes SDOH Interventions    Flowsheet Row Clinical Support from 04/13/2024 in [facility] - A Dept Of [place]. [facility]  SDOH Interventions   Food Insecurity Interventions Intervention Not Indicated  Housing Interventions Intervention Not Indicated  Transportation Interventions Intervention Not Indicated  Utilities Interventions Intervention Not Indicated    SDOH Screenings   Food Insecurity: No Food Insecurity (04/13/2024)  Housing: Low Risk (04/13/2024)  Transportation Needs: No Transportation Needs (04/13/2024)  Utilities: Not At Risk (04/13/2024)  Depression (PHQ2-9): Low Risk (04/13/2024)  Tobacco Use: Low Risk (04/13/2024)    PHQ 2/9:    04/13/2024    9:41 AM  Depression screen PHQ 2/9  Decreased Interest 0  Down, Depressed, Hopeless 0  PHQ - 2 Score 0     Distress Screen completed: No     No data to display            Family/Social Information:  Housing Arrangement: patient lives with her husband and 17yo daughter, Kelly Huffman, (senior in [employer]). 19yo son, Kelly Huffman, is in college at [employer] persons in your life? Husband, mom (1 hr away), friends, church Transportation concerns: no  Employment: Working full time as a environmental consultant. She has access to [employer] and short-term disability  Income source: Employment and husband's employment Financial concerns: No Type of concern: None Food access concerns: no Religious or spiritual practice: Yes-belongs to a church Advanced directives: Yes-has paperwork that needs to be notarized Services Currently in place:  [facility] ; Lexapro for anxiety  Coping/ Adjustment to diagnosis: Patient understands treatment plan and what happens next?  She understands the plan. She is upset that chemo will be part of it, particularly related to hair loss and potential impact on daughter's high school graduation this year. Concerns about diagnosis and/or treatment: Body image, Overwhelmed by information, and Anxiety, hair loss Patient reported stressors: Anxiety/ nervousness and Adjusting to my illness Patient enjoys exercise, time with family/ friends, and car rides Current coping skills/ strengths: Capable of independent living , Manufacturing systems engineer , Motivation for treatment/growth , Religious Affiliation , and Supportive family/friends     SUMMARY: Current SDOH Barriers:  Anxiety related to cancer  Clinical Social Work Clinical Goal(s):  Patient will work with SW to address concerns related to adjustment to cancer  Interventions: Discussed common feeling and emotions when being diagnosed with cancer, and the importance of support during treatment Informed patient of the support team roles and support services at [facility] Provided CSW contact information and encouraged patient to call with any questions or concerns Provided brief mental health counseling with regard to adjustment to cancer/ anxiety around chemo    Follow Up Plan: CSW will follow-up with patient by phone in about 1 week to check on coping Patient verbalizes understanding of plan: Yes    Zekiah Caruth E Oleta Gunnoe, LCSW Clinical Social Worker [facility]

## 2024-04-13 NOTE — Research (Signed)
 [employer] 2021-05 - Specimen Collection Study to Evaluate Biomarkers in Subjects with Cancer    Patient Kelly Huffman was identified by Dr. Loretha as a potential candidate for the above listed study.  This Clinical Research Coordinator met with Kelly Huffman, [id number], on 04/13/2024 in a manner and location that ensures patient privacy to discuss participation in the above listed research study.  Patient is Accompanied by her husband.  A copy of the informed consent document with embedded HIPAA language was provided to the patient.  Patient reads, speaks, and understands English.    Patient was provided with the business card of this Coordinator and encouraged to contact the research team with any questions.  Approximately 15 minutes were spent with the patient reviewing the informed consent documents.  Patient was provided the option of taking informed consent documents home to review and was encouraged to review at their convenience with their support network, including other care providers. Patient took the consent documents home to review.  Pt agreed that the research team may follow up with a phone call during the afternoon on any day next week. Pt. was thanked for their time and knows that they can contact research team at any time with questions or concerns.   Abelardo Jock Clinical Research Coordinator [phone] 04/13/2024 1:29 PM

## 2024-04-13 NOTE — Progress Notes (Signed)
 [facility] CONSULT NOTE  Patient Care Team: Cleotilde Planas, MD as PCP - General (Family Medicine) Tyree Nanetta SAILOR, RN as Oncology Nurse Navigator Aron Shoulders, MD as Consulting Physician (General Surgery) Loretha Ash, MD as Consulting Physician (Hematology and Oncology) Shannon Agent, MD as Consulting Physician (Radiation Oncology)  CHIEF COMPLAINTS/PURPOSE OF CONSULTATION:  New diagnosis of breast cancer  ASSESSMENT & PLAN:  No problem-specific Assessment & Plan notes found for this encounter.  No orders of the defined types were placed in this encounter.    HISTORY OF PRESENTING ILLNESS:  Kelly Huffman 49 y.o. female is here because of left breast cancer.  Discussed the use of AI scribe software for clinical note transcription with the patient, who gave verbal consent to proceed.  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 The first lesion is a palpable, low-grade, ER/PR positive, HER2 negative mass. The second lesion was detected on mammogram as an abnormal density, is not palpable, and is intermediate grade, ER/PR positive, and HER2 positive. The HER2 positive result was confirmed at this visit. The tumors are approximately 4-5 cm apart and will require two separate lumpectomies. She has not undergone surgery or received prior chemotherapy or radiation.  She denies breast pain, nipple discharge, skin changes, new lumps outside of the known lesions, lower extremity swelling, shortness of breath, abnormal bleeding, or other systemic symptoms.  During the visit, she expressed concern regarding the need for chemotherapy, treatment logistics, and anticipated side effects including nausea, fatigue, constipation, and neuropathy. She is considering cold  cap therapy to minimize alopecia and discussed the impact of treatment on her ability to work and manage family responsibilities.  All other systems were reviewed with the patient and are negative.  MEDICAL HISTORY:  Past Medical History:  Diagnosis Date   Anxiety    GERD (gastroesophageal reflux disease)    Plantar fasciitis 09/21/2016    SURGICAL HISTORY: Past Surgical History:  Procedure Laterality Date   BREAST BIOPSY Left 03/25/2024   US  LT BREAST BX W LOC DEV 1ST LESION IMG BX SPEC US  GUIDE 03/25/2024 [facility]   BREAST BIOPSY Left 03/25/2024   US  LT BREAST BX W LOC DEV EA ADD LESION IMG BX SPEC US  GUIDE 03/25/2024 [facility]   BREAST BIOPSY Left 04/04/2024   MM LT BREAST BX W LOC DEV 1ST LESION IMAGE BX SPEC STEREO GUIDE 04/04/2024 [facility]   CESAREAN SECTION     TUBAL LIGATION  2006    SOCIAL HISTORY: Social History   Socioeconomic History   Marital status: Married    Spouse name: Not on file   Number of children: Not on file   Years of education: Not on file   Highest education level: Not on file  Occupational History   Not on file  Tobacco Use   Smoking status: Never    Passive exposure: Never   Smokeless tobacco: Never  Vaping Use   Vaping status: Never Used  Substance and Sexual Activity   Alcohol use: No   Drug use: No   Sexual activity: Yes  Other Topics Concern   Not on file  Social History Narrative   Not on file   Social Drivers of Health   Tobacco Use: Low Risk (04/13/2024)   Patient History    Smoking Tobacco Use: Never  Smokeless Tobacco Use: Never    Passive Exposure: Never  Financial Resource Strain: Not on file  Food Insecurity: No Food Insecurity (04/13/2024)   Epic    Worried About Programme Researcher, Broadcasting/film/video in the Last Year: Never true    Ran Out of Food in the Last Year: Never true  Transportation Needs: No Transportation Needs (04/13/2024)   Epic    Lack of Transportation (Medical): No    Lack of  Transportation (Non-Medical): No  Physical Activity: Not on file  Stress: Not on file  Social Connections: Not on file  Intimate Partner Violence: Not At Risk (04/13/2024)   Epic    Fear of Current or Ex-Partner: No    Emotionally Abused: No    Physically Abused: No    Sexually Abused: No  Depression (PHQ2-9): Low Risk (04/13/2024)   Depression (PHQ2-9)    PHQ-2 Score: 0  Alcohol Screen: Not on file  Housing: Low Risk (04/13/2024)   Epic    Unable to Pay for Housing in the Last Year: No    Number of Times Moved in the Last Year: 0    Homeless in the Last Year: No  Utilities: Not At Risk (04/13/2024)   Epic    Threatened with loss of utilities: No  Health Literacy: Not on file    FAMILY HISTORY: Family History  Problem Relation Age of Onset   Diabetes Mother    Bipolar disorder Mother    Varicose Veins Mother    Heart attack Father    Cancer Maternal Grandfather    Prostate cancer Maternal Grandfather    Heart attack Paternal Grandfather    Heart disease Paternal Uncle     ALLERGIES:  is allergic to cortisone.  MEDICATIONS:  Current Outpatient Medications  Medication Sig Dispense Refill   escitalopram (LEXAPRO) 10 MG tablet Take 10 mg by mouth daily.     No current facility-administered medications for this visit.     PHYSICAL EXAMINATION: ECOG PERFORMANCE STATUS: 0 - Asymptomatic  Vitals:   04/13/24 0939  BP: 138/88  Pulse: 93  Resp: 17  Temp: 98.8 F (37.1 C)  SpO2: 98%   There were no vitals filed for this visit.  GENERAL:alert, no distress and comfortable Left breast mass at 10 clock near the nipple is palpated. Second mass is non palpable. No regional adenopathy CTA bilaterally No LE edema.  LABORATORY DATA:  I have reviewed the data as listed Lab Results  Component Value Date   WBC 6.1 05/13/2018   HGB 12.9 05/13/2018   HCT 42.1 05/13/2018   MCV 88.1 05/13/2018   PLT 230 05/13/2018     Chemistry      Component Value Date/Time   NA 136  05/13/2018 0748   K 3.3 (L) 05/13/2018 0748   CL 106 05/13/2018 0748   CO2 25 05/13/2018 0748   BUN 12 05/13/2018 0748   CREATININE 0.92 05/13/2018 0748      Component Value Date/Time   CALCIUM 8.4 (L) 05/13/2018 0748   ALKPHOS 48 05/13/2018 0748   AST 22 05/13/2018 0748   ALT 14 05/13/2018 0748   BILITOT 0.4 05/13/2018 0748       RADIOGRAPHIC STUDIES: I have personally reviewed the radiological images as listed and agreed with the findings in the report. MM LT BREAST BX W LOC DEV 1ST LESION IMAGE BX SPEC STEREO GUIDE Addendum Date: 04/06/2024 ADDENDUM REPORT: 04/06/2024 15:01 ADDENDUM: PATHOLOGY revealed: Breast, left, needle core biopsy, Upper inner quadrant (ribbon clip)- INVASIVE  DUCTAL CARCINOMA, DUCTAL CARCINOMA IN SITU, INTERMEDIATE GRADE - OVERALL GRADE: 2 - LYMPHOVASCULAR INVASION: NOT IDENTIFIED - CANCER LENGTH: 7 MM - CALCIFICATIONS: NOT IDENTIFIED Pathology results are CONCORDANT with imaging findings, per Dr. Aliene Mir. Pathology results and recommendations below were discussed with patient by telephone. Patient reported biopsy site doing well with no adverse symptoms, and only slight tenderness at the site. Post biopsy care instructions were reviewed, questions were answered and my direct phone number was provided. Patient was instructed to call [facility] for any additional questions or concerns related to biopsy site. RECOMMENDATION: Patient to follow treatment plan / surgical management for recently diagnosed breast cancer. The patient has an appointment with the Breast Multidisciplinary Clinic at [facility] on 04/13/2024. 2. Consider pretreatment bilateral breast MRI to define extent of breast disease. Pathology results reported by Mliss CHARM Molt RN 04/06/2024. Electronically Signed   By: Aliene Lloyd M.D.   On: 04/06/2024 15:01   Result Date: 04/06/2024 CLINICAL DATA:  49 year old woman with biopsy-proven IDC of the LEFT breast  presents for stereotactic guided core needle biopsy of a separate site of architectural distortion. EXAM: LEFT BREAST STEREOTACTIC CORE NEEDLE BIOPSY COMPARISON:  Previous exam(s). FINDINGS: The patient and I discussed the procedure of stereotactic-guided biopsy including benefits and alternatives. We discussed the high likelihood of a successful procedure. We discussed the risks of the procedure including infection, bleeding, tissue injury, clip migration, and inadequate sampling. Informed written consent was given. The usual time out protocol was performed immediately prior to the procedure. Using sterile technique and 1% Lidocaine as local anesthetic, under stereotactic guidance, a 9 gauge vacuum assisted device was used to perform core needle biopsy of the architectural distortion in the upper inner LEFT breast using a superior approach. Lesion quadrant: Upper inner quadrant At the conclusion of the procedure, ribbon shaped tissue marker clip was deployed into the biopsy cavity. Follow-up 2-view mammogram was performed and dictated separately. IMPRESSION: Stereotactic-guided biopsy of LEFT breast architectural distortion. No apparent complications. Electronically Signed: By: Aliene Lloyd M.D. On: 04/04/2024 14:28   US  LIMITED ULTRASOUND INCLUDING AXILLA LEFT BREAST  Result Date: 04/04/2024 CLINICAL DATA:  49 year old woman with biopsy-proven IDC of the LEFT breast presents for further evaluation of separate site of architectural distortion seen in the upper inner LEFT breast. EXAM: ULTRASOUND OF THE LEFT BREAST COMPARISON:  Multiple previous examinations FINDINGS: Targeted sonographic evaluation of the upper inner LEFT breast was performed, however no abnormality corresponding to the architectural distortion was identified. IMPRESSION: Architectural distortion of the upper inner LEFT breast does not have a sonographic correlate. RECOMMENDATION: Stereotactic guided biopsy of LEFT breast architectural  distortion. I have discussed the findings and recommendations with the patient. If applicable, a reminder letter will be sent to the patient regarding the next appointment. BI-RADS CATEGORY  4: Suspicious. Electronically Signed   By: Aliene Lloyd M.D.   On: 04/04/2024 14:31   MM CLIP PLACEMENT LEFT Result Date: 04/04/2024 CLINICAL DATA:  Status post stereotactic guided biopsy of LEFT breast architectural distortion. EXAM: 3D DIAGNOSTIC LEFT MAMMOGRAM POST STEREOTACTIC BIOPSY COMPARISON:  Previous exam(s). ACR Breast Density Category c: The breasts are heterogeneously dense, which may obscure small masses. FINDINGS: 3D Mammographic images were obtained following stereotactic guided biopsy of LEFT breast architectural distortion. The biopsy marking clip is in expected position at the site of biopsy. IMPRESSION: Appropriate positioning of the ribbon shaped biopsy marking clip at the site of biopsy in the upper-inner LEFT breast. Final  Assessment: Post Procedure Mammograms for Marker Placement Electronically Signed   By: Aliene Lloyd M.D.   On: 04/04/2024 14:29   MM CLIP PLACEMENT LEFT Addendum Date: 03/28/2024 ADDENDUM REPORT: 03/28/2024 14:46 ADDENDUM: On further review, architectural distortion is noted in the middle depth of the upper inner LEFT breast. Recommendation: Diagnostic evaluation of LEFT breast architectural distortion. Electronically Signed   By: Aliene Lloyd M.D.   On: 03/28/2024 14:46   Result Date: 03/28/2024 CLINICAL DATA:  Status post ultrasound-guided biopsy of 2 LEFT breast masses. EXAM: 3D DIAGNOSTIC LEFT MAMMOGRAM POST ULTRASOUND BIOPSY COMPARISON:  Previous exam(s). ACR Breast Density Category c: The breasts are heterogeneously dense, which may obscure small masses. FINDINGS: 3D Mammographic images were obtained following ultrasound guided biopsy of 2 LEFT breast masses. The biopsy marking clips are in expected position at the sites of biopsy. IMPRESSION: Appropriate positioning of  the coil and venous shaped biopsy marking clips at the sites of biopsy in the upper inner LEFT breast. Final Assessment: Post Procedure Mammograms for Marker Placement Electronically Signed: By: Aliene Lloyd M.D. On: 03/25/2024 17:26   US  LT BREAST BX W LOC DEV 1ST LESION IMG BX SPEC US  GUIDE Addendum Date: 03/28/2024 ADDENDUM REPORT: 03/28/2024 14:43 ADDENDUM: PATHOLOGY revealed: Site 1. Breast, left, needle core biopsy, 10:00 retroareolar (coil clip)- MICROSCOPIC FRAGMENT SUGGESTIVE OF INTRADUCTAL PAPILLOMA. FIBROCYSTIC CHANGES. Pathology results are CONCORDANT with imaging findings, per Dr. Aliene Mir. PATHOLOGY revealed: Site 2. Breast, left, needle core biopsy, 10:00 2 cmfn (venus clip)- INVASIVE DUCTAL CARCINOMA- OVERALL GRADE: 1- LYMPHOVASCULAR INVASION: NOT IDENTIFIED- CANCER LENGTH: 0.6 CM- CALCIFICATIONS: NOT IDENTIFIED. Pathology results are CONCORDANT with imaging findings, per Dr. Aliene Mir. Pathology results and recommendations below were discussed with patient by telephone. Patient reported biopsy site within normal limits with slight tenderness at the site. Post biopsy care instructions were reviewed, questions were answered and my direct phone number was provided to patient. Patient was instructed to call [facility] if any concerns or questions arise related to the biopsy. RECOMMENDATION: 1. Surgical and oncological consultation. The patient was referred to [facility] with appointment on 04/13/2024. 2. Patient to return for left breast diagnostic mammogram (and ultrasound if deemed necessary) to follow up area of distortion noted on post clip film. Patient will be scheduled for this at her earliest convenience by the schedulers. Pathology results reported by Mliss CHARM Molt RN 03/28/2024. Electronically Signed   By: Aliene Lloyd M.D.   On: 03/28/2024 14:43   Result Date: 03/28/2024 CLINICAL  DATA:  49 year old woman with 2 indeterminate LEFT breast masses presents for ultrasound-guided needle biopsy. EXAM: ULTRASOUND GUIDED LEFT BREAST CORE NEEDLE BIOPSY (x2) COMPARISON:  Previous exam(s). PROCEDURE: I met with the patient and we discussed the procedure of ultrasound-guided biopsy, including benefits and alternatives. We discussed the high likelihood of a successful procedure. We discussed the risks of the procedure, including infection, bleeding, tissue injury, clip migration, and inadequate sampling. Informed written consent was given. The usual time-out protocol was performed immediately prior to the procedure. Site 1: 3 mm LEFT breast mass (10 o'clock retroareolar) Lesion quadrant: Upper inner quadrant Using sterile technique and 1% Lidocaine as local anesthetic, under direct ultrasound visualization, a 14 gauge spring-loaded device was used to perform biopsy of 3 mm LEFT breast mass using a superior approach. At the conclusion of the procedure coil shaped tissue marker clip was deployed into the biopsy cavity. Site 1: 7 mm  LEFT breast mass (10 o'clock 2 CMFN) Lesion quadrant: Upper inner quadrant Using sterile technique and 1% Lidocaine as local anesthetic, under direct ultrasound visualization, a 14 gauge spring-loaded device was used to perform biopsy of 7 mm LEFT breast mass using a superior approach. At the conclusion of the procedure venus shaped tissue marker clip was deployed into the biopsy cavity. Follow up 2 view mammogram was performed and dictated separately. IMPRESSION: Ultrasound guided biopsy of 3 and 7 mm LEFT breast masses. No apparent complications. Electronically Signed: By: Aliene Lloyd M.D. On: 03/25/2024 17:17   US  LT BREAST BX W LOC DEV EA ADD LESION IMG BX SPEC US  GUIDE Addendum Date: 03/28/2024 ADDENDUM REPORT: 03/28/2024 14:43 ADDENDUM: PATHOLOGY revealed: Site 1. Breast, left, needle core biopsy, 10:00 retroareolar (coil clip)- MICROSCOPIC FRAGMENT SUGGESTIVE OF  INTRADUCTAL PAPILLOMA. FIBROCYSTIC CHANGES. Pathology results are CONCORDANT with imaging findings, per Dr. Aliene Mir. PATHOLOGY revealed: Site 2. Breast, left, needle core biopsy, 10:00 2 cmfn (venus clip)- INVASIVE DUCTAL CARCINOMA- OVERALL GRADE: 1- LYMPHOVASCULAR INVASION: NOT IDENTIFIED- CANCER LENGTH: 0.6 CM- CALCIFICATIONS: NOT IDENTIFIED. Pathology results are CONCORDANT with imaging findings, per Dr. Aliene Mir. Pathology results and recommendations below were discussed with patient by telephone. Patient reported biopsy site within normal limits with slight tenderness at the site. Post biopsy care instructions were reviewed, questions were answered and my direct phone number was provided to patient. Patient was instructed to call [facility] if any concerns or questions arise related to the biopsy. RECOMMENDATION: 1. Surgical and oncological consultation. The patient was referred to [facility] with appointment on 04/13/2024. 2. Patient to return for left breast diagnostic mammogram (and ultrasound if deemed necessary) to follow up area of distortion noted on post clip film. Patient will be scheduled for this at her earliest convenience by the schedulers. Pathology results reported by Mliss CHARM Molt RN 03/28/2024. Electronically Signed   By: Aliene Lloyd M.D.   On: 03/28/2024 14:43   Result Date: 03/28/2024 CLINICAL DATA:  49 year old woman with 2 indeterminate LEFT breast masses presents for ultrasound-guided needle biopsy. EXAM: ULTRASOUND GUIDED LEFT BREAST CORE NEEDLE BIOPSY (x2) COMPARISON:  Previous exam(s). PROCEDURE: I met with the patient and we discussed the procedure of ultrasound-guided biopsy, including benefits and alternatives. We discussed the high likelihood of a successful procedure. We discussed the risks of the procedure, including infection, bleeding, tissue injury, clip migration,  and inadequate sampling. Informed written consent was given. The usual time-out protocol was performed immediately prior to the procedure. Site 1: 3 mm LEFT breast mass (10 o'clock retroareolar) Lesion quadrant: Upper inner quadrant Using sterile technique and 1% Lidocaine as local anesthetic, under direct ultrasound visualization, a 14 gauge spring-loaded device was used to perform biopsy of 3 mm LEFT breast mass using a superior approach. At the conclusion of the procedure coil shaped tissue marker clip was deployed into the biopsy cavity. Site 1: 7 mm LEFT breast mass (10 o'clock 2 CMFN) Lesion quadrant: Upper inner quadrant Using sterile technique and 1% Lidocaine as local anesthetic, under direct ultrasound visualization, a 14 gauge spring-loaded device was used to perform biopsy of 7 mm LEFT breast mass using a superior approach. At the conclusion of the procedure venus shaped tissue marker clip was deployed into the biopsy cavity. Follow up 2 view mammogram was performed and dictated separately. IMPRESSION: Ultrasound guided biopsy of 3 and 7 mm LEFT breast masses. No apparent complications. Electronically Signed:  By: Aliene  Mir M.D. On: 03/25/2024 17:17   MM 3D DIAGNOSTIC MAMMOGRAM BILATERAL BREAST Result Date: 03/21/2024 CLINICAL DATA:  49 year old female complaining of a palpable abnormality in the 10 o'clock retroareolar region of the left breast. EXAM: DIGITAL DIAGNOSTIC BILATERAL MAMMOGRAM WITH TOMOSYNTHESIS AND CAD; ULTRASOUND LEFT BREAST LIMITED TECHNIQUE: Bilateral digital diagnostic mammography and breast tomosynthesis was performed. The images were evaluated with computer-aided detection. ; Targeted ultrasound examination of the left breast was performed. COMPARISON:  Previous exam(s). ACR Breast Density Category c: The breasts are heterogeneously dense, which may obscure small masses. FINDINGS: Right breast: No suspicious mass or malignant type microcalcifications identified in the right  breast. Left breast: Radiopaque BB marks the palpable abnormality in the 10 o'clock retroareolar region of the left breast. Underlying the radiopaque BB is a superficial mass with indistinct borders. There are no malignant type microcalcifications. On physical exam, I palpate a discrete mass in the left breast at 10 o'clock 2 cm from the nipple. Targeted ultrasound is performed, showing an irregular hypoechoic mass with irregular margins in the left breast at 10 o'clock 2 cm from the nipple measuring 7 x 5 x 5 mm. This mass accounts for the palpable abnormality. There is a band of hypoechoic tissue which may be an abnormal duct leading to a second mass in the retroareolar region of the breast measuring 3 x 2 x 3 mm. The masses are located proximally 1.1 cm apart. Sonographic evaluation of the left axilla does not show any enlarged adenopathy. IMPRESSION: Two indeterminate masses in the left breast. The first mass accounts for the palpable abnormality and is located 10 o'clock 2 cm from the nipple, measuring 7 mm. The second mass is in the retroareolar region of the breast measuring 3 mm. The 2 masses are interconnected by a band of hypoechoic tissue which may be ductal extension. RECOMMENDATION: Ultrasound-guided core biopsies of the masses in the 10 o'clock region of the left breast 2 cm from the nipple and in the retroareolar region of the breast is recommended. I have discussed the findings and recommendations with the patient. If applicable, a reminder letter will be sent to the patient regarding the next appointment. BI-RADS CATEGORY  4: Suspicious. Electronically Signed   By: Dina  Arceo M.D.   On: 03/21/2024 16:38   US  LIMITED ULTRASOUND INCLUDING AXILLA LEFT BREAST  Result Date: 03/21/2024 CLINICAL DATA:  49 year old female complaining of a palpable abnormality in the 10 o'clock retroareolar region of the left breast. EXAM: DIGITAL DIAGNOSTIC BILATERAL MAMMOGRAM WITH TOMOSYNTHESIS AND CAD; ULTRASOUND  LEFT BREAST LIMITED TECHNIQUE: Bilateral digital diagnostic mammography and breast tomosynthesis was performed. The images were evaluated with computer-aided detection. ; Targeted ultrasound examination of the left breast was performed. COMPARISON:  Previous exam(s). ACR Breast Density Category c: The breasts are heterogeneously dense, which may obscure small masses. FINDINGS: Right breast: No suspicious mass or malignant type microcalcifications identified in the right breast. Left breast: Radiopaque BB marks the palpable abnormality in the 10 o'clock retroareolar region of the left breast. Underlying the radiopaque BB is a superficial mass with indistinct borders. There are no malignant type microcalcifications. On physical exam, I palpate a discrete mass in the left breast at 10 o'clock 2 cm from the nipple. Targeted ultrasound is performed, showing an irregular hypoechoic mass with irregular margins in the left breast at 10 o'clock 2 cm from the nipple measuring 7 x 5 x 5 mm. This mass accounts for the palpable abnormality. There is a band of hypoechoic  tissue which may be an abnormal duct leading to a second mass in the retroareolar region of the breast measuring 3 x 2 x 3 mm. The masses are located proximally 1.1 cm apart. Sonographic evaluation of the left axilla does not show any enlarged adenopathy. IMPRESSION: Two indeterminate masses in the left breast. The first mass accounts for the palpable abnormality and is located 10 o'clock 2 cm from the nipple, measuring 7 mm. The second mass is in the retroareolar region of the breast measuring 3 mm. The 2 masses are interconnected by a band of hypoechoic tissue which may be ductal extension. RECOMMENDATION: Ultrasound-guided core biopsies of the masses in the 10 o'clock region of the left breast 2 cm from the nipple and in the retroareolar region of the breast is recommended. I have discussed the findings and recommendations with the patient. If applicable, a  reminder letter will be sent to the patient regarding the next appointment. BI-RADS CATEGORY  4: Suspicious. Electronically Signed   By: Dina  Arceo M.D.   On: 03/21/2024 16:38    All questions were answered. The patient knows to call the clinic with any problems, questions or concerns. I spent 45 minutes in the care of this patient including H and P, review of records, counseling and coordination of care.     Amber Stalls, MD 04/13/2024 11:25 AM

## 2024-04-13 NOTE — Progress Notes (Addendum)
 REFERRING PROVIDER: Aron Shoulders, MD [address] [place],  [place] [address]  PRIMARY PROVIDER:  Cleotilde Planas, MD  PRIMARY REASON FOR VISIT:  1. Malignant neoplasm of overlapping sites of left breast in female, estrogen receptor positive (HCC)   2. Family history of prostate cancer   3. Family history of bladder cancer     HISTORY OF PRESENT ILLNESS:   Kelly Huffman, a 49 y.o. female, was seen for a [place] cancer genetics consultation at the request of Shoulders Aron, MD due to a personal history of breast cancer.  Kelly Huffman presents today the at [facility] to discuss the possibility of a hereditary predisposition to cancer, genetic testing, and to further clarify her future cancer risks, as well as potential cancer risks for family members.  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 The first lesion is a palpable, low-grade, ER/PR positive, HER2 negative mass. The second lesion is intermediate grade, ER/PR positive, and HER2 positive.    CANCER HISTORY:  Oncology History  Malignant neoplasm of overlapping sites of left breast in female, estrogen receptor positive (HCC)  04/11/2024 Initial Diagnosis   Malignant neoplasm of overlapping sites of left breast in female, estrogen receptor positive (HCC)    RISK FACTORS:  Menarche was at age 43.  First live birth at age 20.  OCP use for approximately 0 years.  Oophorectomy: no.  Hysterectomy: no.  Menopausal status: premenopausal.  Colonoscopy: yes; 07/2023. Mammogram within the last year: yes (1st 2018) Number of breast biopsies: 3, biopsies in 1996, 1999 and current biopsy Up to date with pelvic exams: yes (03/21/2024) Tobacco Use: Never  Past Medical History:  Diagnosis Date   Anxiety    GERD (gastroesophageal reflux disease)    Plantar fasciitis 09/21/2016   Past Surgical History:   Procedure Laterality Date   BREAST BIOPSY Left 03/25/2024   US  LT BREAST BX W LOC DEV 1ST LESION IMG BX SPEC US  GUIDE 03/25/2024 [facility]   BREAST BIOPSY Left 03/25/2024   US  LT BREAST BX W LOC DEV EA ADD LESION IMG BX SPEC US  GUIDE 03/25/2024 [facility]   BREAST BIOPSY Left 04/04/2024   MM LT BREAST BX W LOC DEV 1ST LESION IMAGE BX SPEC STEREO GUIDE 04/04/2024 [facility]   CESAREAN SECTION     TUBAL LIGATION  2006   Social History   Socioeconomic History   Marital status: Married    Spouse name: Not on file   Number of children: Not on file   Years of education: Not on file   Highest education level: Not on file  Occupational History   Not on file  Tobacco Use   Smoking status: Never    Passive exposure: Never   Smokeless tobacco: Never  Vaping Use   Vaping status: Never Used  Substance and Sexual Activity   Alcohol use: No   Drug use: No   Sexual activity: Yes  Other Topics Concern   Not on file  Social History Narrative   Not on file   Social Drivers of Health   Tobacco Use: Low Risk (04/13/2024)   Patient History    Smoking Tobacco Use: Never    Smokeless Tobacco Use: Never    Passive Exposure: Never  Financial Resource Strain: Not on file  Food Insecurity: No Food Insecurity (04/13/2024)   Epic    Worried About Running Out  of Food in the Last Year: Never true    Ran Out of Food in the Last Year: Never true  Transportation Needs: No Transportation Needs (04/13/2024)   Epic    Lack of Transportation (Medical): No    Lack of Transportation (Non-Medical): No  Physical Activity: Not on file  Stress: Not on file  Social Connections: Not on file  Depression (PHQ2-9): Low Risk (04/13/2024)   Depression (PHQ2-9)    PHQ-2 Score: 0  Alcohol Screen: Not on file  Housing: Low Risk (04/13/2024)   Epic    Unable to Pay for Housing in the Last Year: No    Number of Times Moved in the Last Year: 0    Homeless in the Last Year: No  Utilities: Not At  Risk (04/13/2024)   Epic    Threatened with loss of utilities: No  Health Literacy: Not on file    FAMILY HISTORY:  We obtained a detailed, 4-generation family history pasted below.   Allan Bacigalupi is unaware of relatives completing genetic testing for hereditary cancer risks.   Pedigree Summary Maternal Grandfather - Prostate and Bladder Cancer dx. 50's  Significant diagnoses are listed below: Family History  Problem Relation Age of Onset   Diabetes Mother    Bipolar disorder Mother    Varicose Veins Mother    Heart attack Father    Bladder Cancer Maternal Grandfather    Prostate cancer Maternal Grandfather    Heart attack Paternal Grandfather    Heart disease Paternal Uncle    GENETIC COUNSELING ASSESSMENT: Kelly Huffman is a 49 y.o. female with a personal history of breast cancer which is somewhat suggestive of a hereditary cancer predisposition syndrome. We, therefore, discussed and recommended the following at today's visit.   DISCUSSION: We discussed that, in general, most cancer is not inherited in families, but instead is sporadic or familial. Sporadic cancers occur by chance and typically happen at older ages (>50 years) as this type of cancer is caused by genetic changes acquired during an individuals lifetime. Some families have more cancers than would be expected by chance; however, the ages or types of cancer are not consistent with a known genetic mutation or known genetic mutations have been ruled out. This type of familial cancer is thought to be due to a combination of multiple genetic, environmental, hormonal, and lifestyle factors. While this combination of factors likely increases the risk of cancer, the exact source of this risk is not currently identifiable or testable.  We discussed that 5-10% of cancer is the result of germline (heritable) genetic variants, with most cases associated with BRCA1/BRCA2. There are other genes that can be associated with hereditary  cancer syndromes. We discussed that testing is beneficial for several reasons including knowing how to follow individuals after completing their treatment, identifying whether potential treatment options such as PARP inhibitors would be beneficial, and understanding if other family members could be at risk for cancer and allow them to undergo genetic testing.   We reviewed the characteristics, features and inheritance patterns of hereditary cancer syndromes. We also discussed genetic testing, including the appropriate family members to test, the process of testing, insurance coverage and turn-around-time for results. We discussed the implications of a negative, positive, carrier and/or variant of uncertain significant result. Kelly Huffman  was offered a common hereditary cancer panel (40 genes) and an expanded pan-cancer panel (77 genes). Kelly Huffman was informed of the benefits and limitations of each panel, including that expanded pan-cancer panels contain genes  that do not have clear management guidelines at this point in time.  We also discussed that as the number of genes included on a panel increases, the chances of variants of uncertain significance increases.  GENETIC TESTING NATIONAL CRITERIA: Based on Kelly Huffman's personal history of cancer breast cancer diagnosed below the age of 48 she meets medical criteria for genetic testing based on the [employer] (NCCN) guidelines. Despite that she meets criteria, she may still have an out of pocket cost.   GENETIC TESTING CONSENT:  After considering the risks, benefits, and limitations, Kelly Huffman provided informed consent to pursue genetic testing. A blood sample was sent to [facility] for analysis of the [employer] and CancerNext-Expanded+RNA Panel. Results should be available within approximately 7 days' for the initial results, at which point they will be disclosed by telephone to Kelly Huffman , as will  any additional recommendations warranted by these results. Kelly Huffman will receive a summary of her genetic counseling visit and a copy of her results once available. This information will also be available in Epic.  Kelly Huffman: ATM, BARD1, BRCA1, BRCA2, CDH1, CHEK2, NF1, PALB2, PTEN, RAD51C, RAD51D, STK11 and TP53 (sequencing and deletion/duplication) [facility] CancerNext-Expanded + RNAinsight gene panel which includes sequencing, rearrangement, and RNA analysis for the following 77 genes: AIP, ALK, APC, ATM, AXIN2, BAP1, BARD1, BMPR1A, BRCA1, BRCA2, BRIP1, CDC73, CDH1, CDK4, CDKN1B, CDKN2A, CEBPA, CHEK2, CTNNA1, DDX41, DICER1, ETV6, FH, FLCN, GATA2, LZTR1, MAX, MBD4, MEN1, MET, MLH1, MSH2, MSH3, MSH6, MUTYH, NF1, NF2, NTHL1, PALB2, PHOX2B, PMS2, POT1, PRKAR1A, PTCH1, PTEN, RAD51C, RAD51D, RB1, RET, RPS20, RUNX1, SDHA, SDHAF2, SDHB, SDHC, SDHD, SMAD4, SMARCA4, SMARCB1, SMARCE1, STK11, SUFU, TMEM127, TP53, TSC1, TSC2, VHL, and WT1 (sequencing and deletion/duplication); EGFR, HOXB13, KIT, MITF, PDGFRA, POLD1, and POLE (sequencing only); EPCAM and GREM1 (deletion/duplication only).    GENETIC INFORMATION NONDISCRIMINATION ACT (GINA): We discussed that some people do not want to undergo genetic testing due to fear of genetic discrimination.  The Genetic Information Nondiscrimination Act (GINA) was signed into federal law in 2008. GINA prohibits health insurers and most employers from discriminating against individuals based on genetic information (including the results of genetic tests and family history information). According to GINA, health insurance companies cannot consider genetic information to be a preexisting condition, nor can they use it to make decisions regarding coverage or rates. GINA also makes it illegal for most employers to use genetic information in making decisions about hiring, firing, promotion, or terms of employment. It is important to note that GINA does not offer protections for life  insurance, disability insurance, or long-term care insurance. GINA does not apply to those in the [employer], those who work for companies with less than 15 employees, and new life insurance or long-term disability insurance policies.  Health status due to a cancer diagnosis is not protected under GINA. More information about GINA can be found by visiting eliteclients.be. Lastly, we encouraged Kelly Huffman Bro[place] to remain in contact with cancer genetics annually so that we can continuously update the family history and inform her of any changes in cancer genetics and testing that may be of benefit for this family.   Kelly Huffman's questions were answered to her satisfaction today. Our contact information was provided should additional questions or concerns arise. Thank you for the referral and allowing us  to share in the care of your patient.   Resources:  Vicky Schleich was provided with the following:  [employer] Hereditary Cancer Testing  Patient Guide [facility] CancerNext-Expanded + RNAinsight gene list  PLAN:  Testing Ordered: Kelly Huffman and Kelly CancerNext-Expanded + RNAinsight  Clinic Note Faxed/Routed to [facility] PCP Cleotilde Planas, MD   Santana Fryer, MS, [facility]  Certified Genetic Counselor  Email: [email or web address]  Phone: [phone]  I personally spent a total of 30 minutes in the care of the patient today including preparing to see the patient, counseling and educating, placing orders, and documenting clinical information in the EHR. The patient was joined by her husband, Kelly Huffman. Drs. Lanny Stalls, and/or Gudena were available for questions, if needed. _______________________________________________________________________ For Office Staff:  Number of people involved in session: 2 Was an Intern/ student involved with case: yes Education Officer, Environmental, Engineer, Petroleum)

## 2024-04-14 ENCOUNTER — Other Ambulatory Visit: Payer: Self-pay | Admitting: General Surgery

## 2024-04-14 DIAGNOSIS — C50812 Malignant neoplasm of overlapping sites of left female breast: Secondary | ICD-10-CM

## 2024-04-18 ENCOUNTER — Telehealth: Payer: Self-pay

## 2024-04-18 ENCOUNTER — Ambulatory Visit (HOSPITAL_COMMUNITY)

## 2024-04-18 DIAGNOSIS — Z170 Estrogen receptor positive status [ER+]: Secondary | ICD-10-CM

## 2024-04-18 NOTE — Telephone Encounter (Signed)
 [employer] 2021-05 - Specimen Collection Study to Evaluate Biomarkers in Subjects with Cancer    Ms. Kelliher  was contacted by phone at 12:58 PM on 04/18/2024 regarding the participation of the above study. Pt was informed that she is ineligible after a full review of her criteria. Pt did not have any question at this time. Pt was thanked for her time. Pt knows to contact research team any time with research-related questions/concerns.  Pt agreed that the research team may approach her in the future if she becomes eligible for any other research studies according to her treatment plan.  Abelardo Jock Clinical Research Coordinator [phone] 04/18/2024 1:01 PM

## 2024-04-19 ENCOUNTER — Inpatient Hospital Stay: Admitting: Licensed Clinical Social Worker

## 2024-04-19 ENCOUNTER — Other Ambulatory Visit: Payer: Self-pay | Admitting: General Surgery

## 2024-04-19 DIAGNOSIS — Z170 Estrogen receptor positive status [ER+]: Secondary | ICD-10-CM

## 2024-04-19 NOTE — Progress Notes (Signed)
 [facility] CSW Progress Note  Clinical Child Psychotherapist contacted patient by phone to follow-up on emotional support.    Interventions: Provided brief mental health counseling with regard to adjustment to cancer    Patient reports feeling better than she did at [facility] as she has had more time to process. Pt is awaiting some more tests prior to starting chemo. She decided to take some control back by getting her long hair cut chin-length to mentally help the adjustment for when she loses it from treatment.     Follow Up Plan:  CSW will see patient on 1st chemo treatment depending on schedule. Pt encouraged to call as needed prior to that visit    Josha Weekley E Derek Laughter, LCSW Clinical Social Worker [facility]

## 2024-04-21 ENCOUNTER — Encounter: Payer: Self-pay | Admitting: *Deleted

## 2024-04-21 ENCOUNTER — Telehealth: Payer: Self-pay | Admitting: *Deleted

## 2024-04-21 NOTE — Telephone Encounter (Signed)
 Spoke with patient to follow up from [facility] 1/7 and assess navigation needs.  Patient denies any questions or concerns at this time.  Her MRI was r/s to 1/19 due to auth.  I have reached out to [facility] and they have appts this weekend they can offer as well if she wants to come in. Encouraged her to call should she have any questions or concerns. Patient verbalized understanding.

## 2024-04-22 ENCOUNTER — Ambulatory Visit (HOSPITAL_COMMUNITY)
Admission: RE | Admit: 2024-04-22 | Discharge: 2024-04-22 | Disposition: A | Discharge status: Home / Self Care | Source: Ambulatory Visit | Attending: Hematology and Oncology | Admitting: Hematology and Oncology

## 2024-04-22 DIAGNOSIS — Z0189 Encounter for other specified special examinations: Secondary | ICD-10-CM

## 2024-04-22 DIAGNOSIS — Z170 Estrogen receptor positive status [ER+]: Secondary | ICD-10-CM | POA: Diagnosis not present

## 2024-04-22 DIAGNOSIS — C50812 Malignant neoplasm of overlapping sites of left female breast: Secondary | ICD-10-CM | POA: Diagnosis not present

## 2024-04-22 DIAGNOSIS — Z01818 Encounter for other preprocedural examination: Secondary | ICD-10-CM | POA: Diagnosis present

## 2024-04-22 LAB — ECHOCARDIOGRAM COMPLETE
Area-P 1/2: 4.06 cm2
Calc EF: 60 %
S' Lateral: 2.9 cm
Single Plane A2C EF: 57.3 %
Single Plane A4C EF: 60.6 %

## 2024-04-22 NOTE — Progress Notes (Signed)
" °  Echocardiogram 2D Echocardiogram has been performed.  Kelly Huffman 04/22/2024, 10:48 AM "

## 2024-04-23 ENCOUNTER — Ambulatory Visit (HOSPITAL_COMMUNITY)
Admission: RE | Admit: 2024-04-23 | Discharge: 2024-04-23 | Disposition: A | Source: Ambulatory Visit | Attending: General Surgery | Admitting: General Surgery

## 2024-04-23 ENCOUNTER — Encounter (HOSPITAL_COMMUNITY): Payer: Self-pay

## 2024-04-23 DIAGNOSIS — Z170 Estrogen receptor positive status [ER+]: Secondary | ICD-10-CM | POA: Diagnosis present

## 2024-04-23 DIAGNOSIS — C50812 Malignant neoplasm of overlapping sites of left female breast: Secondary | ICD-10-CM | POA: Insufficient documentation

## 2024-04-23 MED ORDER — GADOBUTROL 1 MMOL/ML IV SOLN
6.0000 mL | Freq: Once | INTRAVENOUS | Status: AC | PRN
  Administered 2024-04-23: 6 mL via INTRAVENOUS

## 2024-04-25 ENCOUNTER — Ambulatory Visit (HOSPITAL_COMMUNITY)

## 2024-04-25 ENCOUNTER — Ambulatory Visit: Payer: Self-pay | Admitting: General Surgery

## 2024-04-27 ENCOUNTER — Other Ambulatory Visit: Payer: Self-pay | Admitting: General Surgery

## 2024-04-27 ENCOUNTER — Encounter: Payer: Self-pay | Admitting: *Deleted

## 2024-04-27 ENCOUNTER — Inpatient Hospital Stay: Admission: RE | Admit: 2024-04-27 | Source: Ambulatory Visit

## 2024-04-27 DIAGNOSIS — R9389 Abnormal findings on diagnostic imaging of other specified body structures: Secondary | ICD-10-CM

## 2024-05-01 ENCOUNTER — Other Ambulatory Visit

## 2024-05-02 ENCOUNTER — Encounter

## 2024-05-02 ENCOUNTER — Other Ambulatory Visit

## 2024-05-03 ENCOUNTER — Other Ambulatory Visit: Payer: Self-pay | Admitting: General Surgery

## 2024-05-03 ENCOUNTER — Ambulatory Visit: Payer: Self-pay

## 2024-05-03 ENCOUNTER — Telehealth: Payer: Self-pay

## 2024-05-03 DIAGNOSIS — Z1379 Encounter for other screening for genetic and chromosomal anomalies: Secondary | ICD-10-CM

## 2024-05-03 DIAGNOSIS — R9389 Abnormal findings on diagnostic imaging of other specified body structures: Secondary | ICD-10-CM

## 2024-05-03 NOTE — Telephone Encounter (Signed)
 I contacted  Travia Onstad to discuss her genetic testing results. No pathogenic variants were identified in the 77 genes analyzed. One variant of uncertain significance was found in the DICER1 gene.   Discussed that we do not know why she has  cancer or why there is cancer in the family. It could be due to a different gene that we are not testing, or maybe our current technology may not be able to pick something up.  It will be important for her to keep in contact with genetics to keep up with whether additional testing may be needed.Detailed clinic note to follow.  Trynity will review her results and results disclosure note via MyChart.   The test report will be scanned into EPIC and will be located under the Molecular Pathology section of the Results Review tab.  A portion of the result report is included below for reference.    Santana Fryer, MS, CGC  Certified Genetic Counselor  Email: [email or web address]  Phone: [phone]

## 2024-05-04 ENCOUNTER — Inpatient Hospital Stay: Admission: RE | Admit: 2024-05-04 | Discharge: 2024-05-04 | Attending: General Surgery | Admitting: General Surgery

## 2024-05-04 ENCOUNTER — Inpatient Hospital Stay
Admission: RE | Admit: 2024-05-04 | Discharge: 2024-05-04 | Disposition: A | Source: Ambulatory Visit | Attending: General Surgery | Admitting: General Surgery

## 2024-05-04 DIAGNOSIS — R9389 Abnormal findings on diagnostic imaging of other specified body structures: Secondary | ICD-10-CM

## 2024-05-04 MED ORDER — GADOPICLENOL 0.5 MMOL/ML IV SOLN
6.0000 mL | Freq: Once | INTRAVENOUS | Status: AC | PRN
  Administered 2024-05-04: 6 mL via INTRAVENOUS

## 2024-05-05 ENCOUNTER — Other Ambulatory Visit: Payer: Self-pay | Admitting: General Surgery

## 2024-05-05 DIAGNOSIS — C50812 Malignant neoplasm of overlapping sites of left female breast: Secondary | ICD-10-CM

## 2024-05-05 LAB — SURGICAL PATHOLOGY

## 2024-05-06 ENCOUNTER — Ambulatory Visit: Payer: Self-pay | Admitting: General Surgery

## 2024-05-06 NOTE — Telephone Encounter (Signed)
 Discussed path with patient.  Will recommend mastectomy. Will arrange for plastics referral and follow up with me.

## 2024-05-08 DIAGNOSIS — Z1379 Encounter for other screening for genetic and chromosomal anomalies: Secondary | ICD-10-CM | POA: Insufficient documentation

## 2024-05-08 NOTE — Telephone Encounter (Signed)
 I contacted  Kelly Huffman to discuss her genetic testing results. No pathogenic variants were identified in the 77 genes analyzed. One variant of uncertain significance was found in the DICER1 gene.   Discussed that we do not know why she has cancer or why there is cancer in the family. It could be due to a different gene that we are not testing, or maybe our current technology may not be able to pick something up.  It will be important for her to keep in contact with genetics to keep up with whether additional testing may be needed. Detailed clinic note to follow.  [name] will review their results and results disclosure note via MyChart.   The test report will be scanned into EPIC and will be located under the Molecular Pathology section of the Results Review tab.  A portion of the result report is included below for reference.    Santana Fryer, MS, CGC  Certified Genetic Counselor  Email: [email or web address]  Phone: [phone]

## 2024-05-10 ENCOUNTER — Telehealth: Payer: Self-pay

## 2024-05-10 ENCOUNTER — Telehealth: Payer: Self-pay | Admitting: *Deleted

## 2024-05-10 ENCOUNTER — Encounter: Payer: Self-pay | Admitting: *Deleted

## 2024-05-10 NOTE — Telephone Encounter (Signed)
 Pt called stating that her surgery was being changed from a lumpectomy to a mastectomy. She was wondering if this would have any impact on her treatment plan as far as getting chemo and then radiation. RN spoke w/ Iruku MD and was told that it would have no impact on her treatment plan bu that radiation will depend on the result of the surgery path report. RN communicated this info to pt. Pt had questions regarding the timeline of surgery and chemo. RN sent inbasket to [facility] nurse navigator to f/u on this w/ the pt. Pt informed of this and verbalized understanding.

## 2024-05-11 NOTE — Telephone Encounter (Signed)
 error

## 2024-05-12 ENCOUNTER — Inpatient Hospital Stay: Attending: Hematology and Oncology | Admitting: Licensed Clinical Social Worker

## 2024-05-12 NOTE — Progress Notes (Signed)
 [facility] CSW Progress Note  Clinical Child Psychotherapist contacted patient by phone to follow-up on emotional support.  Pt has had a difficult week with the news that she will now need a mastectomy. This is difficult due to the permanency versus the chemo that was anxiety-inducing, but she was able to view as short-term.  Pt does have a co-worker and aunt who have had mastectomies and knows she can talk to them when she is ready.  Pt is trying to get back to the gym and church now that the weather is improving. She previously meditated (20 min meditations) when she was very anxious, but is having a hard time starting that practice again.   Interventions: Provided brief mental health counseling with regard to adjustment to cancer and upcoming surgery/ treatment  Discussed mindfulness/ breathing strategies      Plan:  Patient will start short (up to 5 min) mindful meditation and/or breathing exercises daily. Can use Calm/ Insight timer/ other app for support  Patient will contact CSW with any support or resource needs    Decorey Wahlert E Yeraldine Forney, LCSW Clinical Social Worker [facility]

## 2024-05-13 ENCOUNTER — Other Ambulatory Visit: Payer: Self-pay | Admitting: General Surgery

## 2024-05-27 ENCOUNTER — Encounter

## 2024-05-31 ENCOUNTER — Encounter (HOSPITAL_BASED_OUTPATIENT_CLINIC_OR_DEPARTMENT_OTHER): Payer: Self-pay

## 2024-05-31 ENCOUNTER — Encounter

## 2024-05-31 ENCOUNTER — Ambulatory Visit (HOSPITAL_BASED_OUTPATIENT_CLINIC_OR_DEPARTMENT_OTHER): Admit: 2024-05-31 | Admitting: General Surgery
